# Patient Record
Sex: Female | Born: 1975 | Race: White | Hispanic: No | Marital: Married | Smoking: Never smoker
Health system: Southern US, Community
[De-identification: ages and names within clinical notes are randomized; demographics above are authoritative.]

## PROBLEM LIST (undated history)

## (undated) DIAGNOSIS — F419 Anxiety disorder, unspecified: Secondary | ICD-10-CM

## (undated) DIAGNOSIS — K429 Umbilical hernia without obstruction or gangrene: Secondary | ICD-10-CM

## (undated) DIAGNOSIS — F32A Depression, unspecified: Secondary | ICD-10-CM

## (undated) DIAGNOSIS — Z9889 Other specified postprocedural states: Secondary | ICD-10-CM

## (undated) DIAGNOSIS — R112 Nausea with vomiting, unspecified: Secondary | ICD-10-CM

## (undated) DIAGNOSIS — J45909 Unspecified asthma, uncomplicated: Secondary | ICD-10-CM

## (undated) DIAGNOSIS — K649 Unspecified hemorrhoids: Secondary | ICD-10-CM

## (undated) DIAGNOSIS — F329 Major depressive disorder, single episode, unspecified: Secondary | ICD-10-CM

## (undated) DIAGNOSIS — G43829 Menstrual migraine, not intractable, without status migrainosus: Secondary | ICD-10-CM

## (undated) DIAGNOSIS — Z8489 Family history of other specified conditions: Secondary | ICD-10-CM

## (undated) DIAGNOSIS — Q796 Ehlers-Danlos syndrome, unspecified: Secondary | ICD-10-CM

## (undated) HISTORY — PX: ANKLE SURGERY: SHX546

## (undated) HISTORY — DX: Menstrual migraine, not intractable, without status migrainosus: G43.829

## (undated) HISTORY — PX: WISDOM TOOTH EXTRACTION: SHX21

## (undated) HISTORY — DX: Ehlers-Danlos syndrome, unspecified: Q79.60

## (undated) HISTORY — PX: SHOULDER ARTHROSCOPY: SHX128

## (undated) HISTORY — PX: APPENDECTOMY: SHX54

## (undated) HISTORY — PX: MOUTH SURGERY: SHX715

## (undated) HISTORY — DX: Unspecified hemorrhoids: K64.9

## (undated) HISTORY — DX: Major depressive disorder, single episode, unspecified: F32.9

## (undated) HISTORY — PX: TUBAL LIGATION: SHX77

## (undated) HISTORY — PX: HERNIA REPAIR: SHX51

## (undated) HISTORY — PX: TONSILLECTOMY AND ADENOIDECTOMY: SHX28

## (undated) HISTORY — PX: GANGLION CYST EXCISION: SHX1691

## (undated) HISTORY — DX: Depression, unspecified: F32.A

## (undated) HISTORY — PX: INTUSSUSCEPTION REPAIR: SHX1847

## (undated) HISTORY — PX: TYMPANOSTOMY TUBE PLACEMENT: SHX32

---

## 2004-12-25 ENCOUNTER — Ambulatory Visit (HOSPITAL_COMMUNITY): Admission: RE | Admit: 2004-12-25 | Discharge: 2004-12-25 | Payer: Self-pay | Admitting: Family Medicine

## 2005-10-31 ENCOUNTER — Ambulatory Visit (HOSPITAL_COMMUNITY): Admission: RE | Admit: 2005-10-31 | Discharge: 2005-10-31 | Payer: Self-pay | Admitting: Orthopedic Surgery

## 2007-03-25 ENCOUNTER — Inpatient Hospital Stay (HOSPITAL_COMMUNITY): Admission: AD | Admit: 2007-03-25 | Discharge: 2007-03-30 | Payer: Self-pay | Admitting: Obstetrics and Gynecology

## 2007-03-27 ENCOUNTER — Ambulatory Visit: Payer: Self-pay | Admitting: Internal Medicine

## 2007-03-27 ENCOUNTER — Encounter (INDEPENDENT_AMBULATORY_CARE_PROVIDER_SITE_OTHER): Payer: Self-pay | Admitting: Obstetrics and Gynecology

## 2007-03-27 ENCOUNTER — Encounter: Payer: Self-pay | Admitting: Obstetrics and Gynecology

## 2007-03-31 ENCOUNTER — Ambulatory Visit (HOSPITAL_COMMUNITY): Admission: RE | Admit: 2007-03-31 | Discharge: 2007-03-31 | Payer: Self-pay | Admitting: Obstetrics and Gynecology

## 2007-04-03 ENCOUNTER — Inpatient Hospital Stay (HOSPITAL_COMMUNITY): Admission: AD | Admit: 2007-04-03 | Discharge: 2007-04-03 | Payer: Self-pay | Admitting: Obstetrics and Gynecology

## 2007-04-06 ENCOUNTER — Ambulatory Visit (HOSPITAL_COMMUNITY): Admission: RE | Admit: 2007-04-06 | Discharge: 2007-04-06 | Payer: Self-pay | Admitting: Obstetrics and Gynecology

## 2007-04-10 ENCOUNTER — Inpatient Hospital Stay (HOSPITAL_COMMUNITY): Admission: AD | Admit: 2007-04-10 | Discharge: 2007-04-16 | Payer: Self-pay | Admitting: Obstetrics and Gynecology

## 2007-04-13 ENCOUNTER — Encounter (INDEPENDENT_AMBULATORY_CARE_PROVIDER_SITE_OTHER): Payer: Self-pay | Admitting: Obstetrics and Gynecology

## 2009-01-30 ENCOUNTER — Inpatient Hospital Stay (HOSPITAL_COMMUNITY): Admission: AD | Admit: 2009-01-30 | Discharge: 2009-02-01 | Payer: Self-pay | Admitting: Obstetrics and Gynecology

## 2009-02-16 ENCOUNTER — Ambulatory Visit (HOSPITAL_COMMUNITY): Admission: RE | Admit: 2009-02-16 | Discharge: 2009-02-16 | Payer: Self-pay | Admitting: Obstetrics and Gynecology

## 2009-03-31 ENCOUNTER — Inpatient Hospital Stay (HOSPITAL_COMMUNITY): Admission: AD | Admit: 2009-03-31 | Discharge: 2009-04-01 | Payer: Self-pay | Admitting: Obstetrics and Gynecology

## 2009-05-12 ENCOUNTER — Encounter (INDEPENDENT_AMBULATORY_CARE_PROVIDER_SITE_OTHER): Payer: Self-pay | Admitting: Obstetrics and Gynecology

## 2009-05-12 ENCOUNTER — Inpatient Hospital Stay (HOSPITAL_COMMUNITY): Admission: RE | Admit: 2009-05-12 | Discharge: 2009-05-15 | Payer: Self-pay | Admitting: Obstetrics and Gynecology

## 2009-09-26 ENCOUNTER — Ambulatory Visit: Payer: Self-pay | Admitting: Family

## 2009-09-26 DIAGNOSIS — IMO0002 Reserved for concepts with insufficient information to code with codable children: Secondary | ICD-10-CM | POA: Insufficient documentation

## 2009-09-26 HISTORY — DX: Reserved for concepts with insufficient information to code with codable children: IMO0002

## 2009-11-11 ENCOUNTER — Encounter: Payer: Self-pay | Admitting: Family Medicine

## 2009-11-14 ENCOUNTER — Ambulatory Visit: Payer: Self-pay | Admitting: Family

## 2010-01-09 ENCOUNTER — Telehealth: Payer: Self-pay | Admitting: Family

## 2010-03-16 ENCOUNTER — Telehealth: Payer: Self-pay | Admitting: Family

## 2010-04-29 ENCOUNTER — Encounter: Payer: Self-pay | Admitting: *Deleted

## 2010-04-29 ENCOUNTER — Encounter: Payer: Self-pay | Admitting: Obstetrics and Gynecology

## 2010-05-08 ENCOUNTER — Ambulatory Visit
Admission: RE | Admit: 2010-05-08 | Discharge: 2010-05-08 | Payer: Self-pay | Source: Home / Self Care | Attending: Family | Admitting: Family

## 2010-05-08 DIAGNOSIS — G43909 Migraine, unspecified, not intractable, without status migrainosus: Secondary | ICD-10-CM | POA: Insufficient documentation

## 2010-05-08 NOTE — Progress Notes (Signed)
  Phone Note Outgoing Call   Summary of Call: Spoke with patient.  She discontinued zoloft due to somnolence.  Mood, however was improved when she was taking this.  She wishes to try something different.  Pt is no longer breast feeding.  Will give patient trial of Effexor.  Initial call taken by: Lemont Fillers FNP,  January 09, 2010 2:39 PM    New/Updated Medications: VENLAFAXINE HCL 37.5 MG XR24H-CAP (VENLAFAXINE HCL) one cap by mouth once daily for one week.  Increase to 2 caps by mouth daily on second week as tolerated. Prescriptions: VENLAFAXINE HCL 37.5 MG XR24H-CAP (VENLAFAXINE HCL) one cap by mouth once daily for one week.  Increase to 2 caps by mouth daily on second week as tolerated.  #60 x 2   Entered and Authorized by:   Lemont Fillers FNP   Signed by:   Lemont Fillers FNP on 01/09/2010   Method used:   [phone][facility]       [address] [place], [place]  [address]       Ph: [phone] or [phone]       Fax: [phone]   RxID:   [id number]

## 2010-05-08 NOTE — Assessment & Plan Note (Signed)
Summary: 1 month roa//lch--Rm 5   Vital Signs:  Patient profile:   35 year old female Height:      66 inches Weight:      122.25 pounds BMI:     19.80 Temp:     98.0 degrees F oral Pulse rate:   60 / minute Pulse rhythm:   regular Resp:     12 per minute BP sitting:   90 / 60  (left arm) Cuff size:   regular  Vitals Entered By: Mervin Kung CMA Duncan Dull) (November 14, 2009 2:31 PM) CC: Room 5  1 month follow up. Is Patient Diabetic? No   CC:  Room 5  1 month follow up.Marland Kitchen  History of Present Illness: Ms Mctigue is a 35 year old female who presents today for follow up of her post-partum depression.   1)Post-partum depression- Last visit she was started on Zoloft.  She did not increase dose form 25 to 50mg  since she was still nursing.  Overall, she has not noticed much change since starting the medication.  Denies any side effects.  Appetite is fair.  Pt has lost 2 pounds since last visit.   2)Mastitis- Notes that she has recently discontinued nursing and developed mastitis for which she is taking dicloxacillin.  Initially she had fever 102, malaise, breast pain.  Now feeling much better.   3)Dysplastic mole removal- Reports that last week she had a dysplastic mole removed by Dr. Dorita Sciara PA.  She has a follow up apt scheduled in 3 months with Dermatology.    Allergies: 1)  ! Ceclor 2)  ! Morphine  Past History:  Past Medical History: Gilbert's Depression S/P Concussion as teenager Dysplastic mole removal 8/11 (Dr. Dorita Sciara office)  Review of Systems       see hpi  Physical Exam  General:  Well-developed,well-nourished,in no acute distress; alert,appropriate and cooperative throughout examination Lungs:  Normal respiratory effort, chest expands symmetrically. Lungs are clear to auscultation, no crackles or wheezes. Heart:  Normal rate and regular rhythm. S1 and S2 normal without gallop, murmur, click, rub or other extra sounds. Psych:  Oriented X3, memory intact for  recent and remote, normally interactive, good eye contact, not anxious appearing, and not depressed appearing.     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  Pt to call us in 1 month and let us know how she is feeling.  Hopefully, weight will stabilize now that she is no longer nursing.    Complete Medication List: 1)  One-a-day Womens Prenatal 28-0.8 & 440 Mg Misc (Prenatal vit-fe fum-fa-omega) .... One daily 2)  Zoloft 50 Mg Tabs (Sertraline hcl) .... One tablet by mouth daily 3)  Dicloxacillin Sodium 250 Mg Caps (Dicloxacillin sodium) .Marland Kitchen.. 1 by mouth three times a day  Patient Instructions: 1)  Please call us in 1 month to let us know how you are doing.    Current Allergies (reviewed today): ! CECLOR ! MORPHINE

## 2010-05-08 NOTE — Progress Notes (Signed)
  Phone Note Other Incoming   Summary of Call: Pt requests rx be sent in as 75mg  tablets.  Rx sent to pharmacy.  Initial call taken by: Lemont Fillers FNP,  March 16, 2010 3:03 PM    New/Updated Medications: VENLAFAXINE HCL 75 MG XR24H-CAP (VENLAFAXINE HCL) one cap by mouth once daily Prescriptions: VENLAFAXINE HCL 75 MG XR24H-CAP (VENLAFAXINE HCL) one cap by mouth once daily  #30 x 5   Entered and Authorized by:   Lemont Fillers FNP   Signed by:   Lemont Fillers FNP on 03/16/2010   Method used:   [facility] [phone][facility] ([facility]ail[facility]       [address] [place], [place]  [address]       Ph: [phone] or [phone]       Fax: [phone]   RxID:   [id number]

## 2010-05-08 NOTE — Assessment & Plan Note (Signed)
Summary: NEW PATIENT--PER [name]   Vital Signs:  Patient profile:   35 year old female Height:      66 inches Weight:      124 pounds BMI:     20.09 Pulse rate:   67 / minute BP sitting:   95 / 56  (left arm) Cuff size:   regular  Vitals Entered By: Geanie Kenning (September 26, 2009 3:23 PM) CC: get established Is Patient Diabetic? No   CC:  get established.  History of Present Illness: Ms [name] is a 35 year old female who presents today to establish care.  She is 4 months post partum and wishes to discuss concern of depression.  During her most recent pregnancy she was confined to bedrest at 23 weeks.  The child was born at term and is healthy.  During her first pregnancy with her now 52 1/2 year old son,  she delivered at 42 weeks and 6 days.  When the child was 34 months old they learned that he has mild CP.  She expresses that she continues to struggle with stress related to her son's pre-term birth and with his diagnosis of CP.    Most recently over this past winter while on bedrest, she noted that she became depressed.  She did not seek medical treatment at that time due to pregnancy and being confined to bedrest.  Now she notes that she "snaps" easily at her toddler.  She notes crying spells and difficulty motivating in the mornings.  Currently, she is not sleeping well.  She also reports poor appetite.  Denies any significant issues with anxiety- except worry about her children at times. 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  She is currently nursing and discussed initiation of SSRI with her child's pediatrician.  They are comfortable with her starting Zoloft.    Preventive Screening-Counseling & Management  Alcohol-Tobacco     Alcohol drinks/day: 0     Smoking Status: never  Problems Prior to Update: 1)  Postpartum Depression  (ICD-648.40)  Medications Prior to Update: 1)  One-A-Day Womens Prenatal 28-0.8 & 440 Mg Misc  (Prenatal Vit-Fe Fum-Fa-Omega) .... One Daily  Current Medications (verified): 1)  One-A-Day Womens Prenatal 28-0.8 & 440 Mg Misc (Prenatal Vit-Fe Fum-Fa-Omega) .... One Daily  Allergies (verified): 1)  ! Ceclor 2)  ! Morphine  Comments:  Nurse/Medical Assistant: The patient's medications and allergies were reviewed with the patient and were updated in the Medication and Allergy Lists. Geanie Kenning (September 26, 2009 3:23 PM)  Past History:  Past Medical History: Last updated: 09/20/2009 Gilbert's Depression S/P Concussion as teenager  Past Surgical History: Last updated: 09/20/2009 Intussusception at 7 months, 1980 (incidental appendectomy) Ear Tubes 1983 Tonsils and Adenoids 1987 Epigastric Hernia repair 1994 R Ankle Surgery 1996 [place]ome teeth 1996 Gum Graft (oral surgery) 1998 L wrist ganglion cyst removal x2 2000 R shoulder surgery 2003 Csection x2 2009, 2011 Tubal Ligation  Family History: Last updated: 09/20/2009 + ANA, ? Lupus- Mom Hyperlipidemia- Dad Polycystic Kidney Dz- MGM, maternal uncle GBM brain tumor- maternal uncle DM- PGM Pancreatic Ca- MGF CAD- PGM Alzheimer's- PGF  Social History: Last updated: 09/20/2009 married to [place] 2 children- Hart Carwin (2009), Raquel Sarna (2011) Hart Carwin w/ mild CP (ex-preemie) [place] [employer]  Risk Factors: Alcohol Use: 0 (09/26/2009)  Risk Factors: Smoking Status: never (09/26/2009)  Social History: Smoking Status:  never  Physical Exam  General:  Well-developed,well-nourished,in no acute  distress; alert,appropriate and cooperative throughout examination Psych:  Cognition and judgment appear intact. Alert and cooperative with normal attention span and concentration. No apparent delusions, illusions, hallucinations   Impression & Recommendations:  Problem # 1:  POSTPARTUM DEPRESSION (ICD-648.40) Assessment New Will initiate trial of Zoloft.  Start at 25mg  by mouth daily and plan to titrate upward to 50mg  in 1  week as tolerated.  30 minutes spent with patient.   Greater than 50% of this time was spent counseling patient on her depression.  We also discussed the possiblity of therapy and she will consider this.    Complete Medication List: 1)  One-a-day Womens Prenatal 28-0.8 & 440 Mg Misc (Prenatal vit-fe fum-fa-omega) .... One daily 2)  Zoloft 50 Mg Tabs (Sertraline hcl) .... Start 1/2 tab by mouth daily x 1 week, then increase to 1 tab by mouth daily on second week  Patient Instructions: 1)  Please start 1/2 tablet one a day for one week, then increase to a full tablet. 2)  It will likely take several weeks before you will notice improvement. 3)  Side effects of this medicine may include drowsiness or nausea.  If this becomes an issue for you call for further instructions. 4)  Very rarely people may develop suicidal thoughts when taking these types of medicines- should this happen to you, discontinue medication and go directly to the emergency room. 5)  Please arrange a follow up appointment in 1 month.  Prescriptions: ZOLOFT 50 MG TABS (SERTRALINE HCL) start 1/2 tab by mouth daily x 1 week, then increase to 1 tab by mouth daily on second week  #30 x 2   Entered and Authorized by:   Nance Pear FNP   Signed by:   Nance Pear FNP on 09/26/2009   Method used:   [facility]        [facility] [phone]       [place], [place]  [address]       Ph: [phone] or [phone]       Fax: [phone]   RxID:   [id number]

## 2010-05-08 NOTE — Miscellaneous (Signed)
Summary: Orders Update  Clinical Lists Changes  Medications: Added new medication of DICLOXACILLIN SODIUM 250 MG CAPS (DICLOXACILLIN SODIUM) 1 by mouth three times a day - Signed Rx of DICLOXACILLIN SODIUM 250 MG CAPS (DICLOXACILLIN SODIUM) 1 by mouth three times a day;  #30 x 0;  Signed;  Entered by: Loreen Freud DO;  Authorized by: Loreen Freud DO;  Method used: [facility]  [place] [address] [address] [place], [place], [place]  [address], Ph: [phone] or [phone], Fax: [phone]    Prescriptions: DICLOXACILLIN SODIUM 250 MG CAPS (DICLOXACILLIN SODIUM) 1 by mouth three times a day  #30 x 0   Entered and Authorized by:   Loreen Freud DO   Signed by:   Loreen Freud DO on 11/11/2009   Method used:   [facility]        [facility]  [place] [address] ([facility]ail)       [address]       [place], [place]  [address]       Ph: [phone] or [phone]       Fax: [phone]   RxID:   [id number]

## 2010-05-16 NOTE — Assessment & Plan Note (Signed)
Summary: menstral migraines//lch--rm 4   Vital Signs:  Patient profile:   35 year old female Height:      66 inches Weight:      119.50 pounds BMI:     19.36 Temp:     97.9 degrees F oral Pulse rate:   84 / minute Pulse rhythm:   regular Resp:     16 per minute BP sitting:   80 / 60  (right arm) Cuff size:   regular  Vitals Entered By: Mervin Kung CMA Duncan Dull) (May 08, 2010 2:16 PM) CC: Pt states she is having menstrual related migraines x 6 months and they are getting worse in severity. Is Patient Diabetic? No Pain Assessment Patient in pain? no      Comments Pt no longer takes Zoloft. Venlafaxine is 75mg  once daily. Nicki Guadalajara Fergerson CMA Duncan Dull)  May 08, 2010 2:21 PM    Primary Care Provider:  Lemont Fillers FNP  CC:  Pt states she is having menstrual related migraines x 6 months and they are getting worse in severity..  History of Present Illness: Ms. Bashor is a 35 year old female who presents today with chief complaint of headache.  She reports that her headaches start several days before each period and lasts several days after the start of her period.  She has had 6 periods since she stopped breasfeeding.  Notes on Friday she had associated photophobia with driving. She reports that this weekend "nothing made it better." She had severe left sided head pain with associated  left facial tenderness and sense of left eye "bulging."   Also had associated nausea and vomitting which improved with use of zofran.  Has been needing to use excedrin Migraine on a daily basis with minimal relief. Reports period started today.  Periods have had heavier bleeding recently since she stopped breast feeding.   Depression- feels overall that this is improved on the Effexor.  Wishes to continue the same.   Allergies: 1)  ! Ceclor 2)  ! Morphine  Past History:  Past Medical History: Last updated: 11/14/2009 Gilbert's Depression S/P Concussion as teenager Dysplastic mole  removal 8/11 (Dr. Dorita Sciara office)  Past Surgical History: Intussusception at 7 months, 1980 (incidental appendectomy) Ear Tubes 1983 Tonsils and Adenoids 1987 Epigastric Hernia repair 1994 R Ankle Surgery 1996 Wisdom  teeth 1996 Gum Graft (oral surgery) 1998 L wrist ganglion cyst removal x2 2000 R shoulder surgery 2003 Csection x2 2009, 2011 Tubal Ligation  Review of Systems       see HPI  Physical Exam  General:  Well-developed,well-nourished,in no acute distress; alert,appropriate and cooperative throughout examination Neurologic:  alert & oriented X3 and gait normal.   Psych:  Cognition and judgment appear intact. Alert and cooperative with normal attention span and concentration. No apparent delusions, illusions, hallucinations   Impression & Recommendations:  Problem # 1:  MIGRAINE HEADACHE (ICD-346.90) Assessment Deteriorated Patient with what appears to be migraine HA's exacerbated by hormonal changes associated with menstrual cycle.  Will give patient a trial of imitrex to be used on a as needed basis.  Will also add naproxen prophylactically 2-3 days before the start of each period and then continue for several days into her menses.  We discussed addition of seasonique, however she wishes to try to avoid hormones at this time.   Her updated medication list for this problem includes:    Imitrex 50 Mg Tabs (Sumatriptan succinate) ..... One tablet by mouth at start of migraine, may repeat  in 2 hours if needed.    Naproxen 500 Mg Tabs (Naproxen) ..... One tablet by mouth two times a day twice daily for 2-3 days before period and 2-3 days in to your period.  Problem # 2:  POSTPARTUM DEPRESSION (ICD-648.40) Assessment: Improved Depression appears stable. She is tolerating Venlafaxine without difficulty.  Continue same.    Complete Medication List: 1)  One-a-day Womens Prenatal 28-0.8 & 440 Mg Misc (Prenatal vit-fe fum-fa-omega) .... One daily 2)  Venlafaxine Hcl 75 Mg  Xr24h-cap (Venlafaxine hcl) .... One cap by mouth once daily 3)  Imitrex 50 Mg Tabs (Sumatriptan succinate) .... One tablet by mouth at start of migraine, may repeat in 2 hours if needed. 4)  Naproxen 500 Mg Tabs (Naproxen) .... One tablet by mouth two times a day twice daily for 2-3 days before period and 2-3 days in to your period.  Patient Instructions: 1)  Call if your symptoms worsen or do not improve.  Prescriptions: NAPROXEN 500 MG TABS (NAPROXEN) one tablet by mouth two times a day twice daily for 2-3 days before period and 2-3 days in to your period.  #30 x 3   Entered and Authorized by:   Lemont Fillers FNP   Signed by:   Lemont Fillers FNP on 05/08/2010   Method used:   [facility] to        [facility]  [address] [phone][facility] ([facility]ail)       [address] [place], [place]  [address]       Ph: [phone] or [phone]       Fax: [phone]   RxID:   [id number] IMITREX 50 MG TABS (SUMATRIPTAN SUCCINATE) one tablet by mouth at start of migraine, may repeat in 2 hours if needed.  #10 x 0   Entered and Authorized by:   Lemont Fillers FNP   Signed by:   Lemont Fillers FNP on 05/08/2010   Method used:   [facility]        [facility]  [address] [phone][facility] ([facility]ail)       [address] [place], [place]  [address]       Ph: [phone] or [phone]       Fax: [phone]   RxID:   [id number]    Orders Added: 1)  Est. Patient Level III [42706]    Current Allergies (reviewed today): ! CECLOR ! MORPHINE

## 2010-06-27 LAB — CBC
HCT: 30.3 % — ABNORMAL LOW (ref 36.0–46.0)
HCT: 37 % (ref 36.0–46.0)
Hemoglobin: 10.4 g/dL — ABNORMAL LOW (ref 12.0–15.0)
Hemoglobin: 12.5 g/dL (ref 12.0–15.0)
MCHC: 33.8 g/dL (ref 30.0–36.0)
MCHC: 34.3 g/dL (ref 30.0–36.0)
MCV: 98.8 fL (ref 78.0–100.0)
MCV: 99.5 fL (ref 78.0–100.0)
Platelets: 150 10*3/uL (ref 150–400)
Platelets: 222 10*3/uL (ref 150–400)
RBC: 3.04 MIL/uL — ABNORMAL LOW (ref 3.87–5.11)
RBC: 3.74 MIL/uL — ABNORMAL LOW (ref 3.87–5.11)
RDW: 12.9 % (ref 11.5–15.5)
RDW: 13.3 % (ref 11.5–15.5)
WBC: 11.3 10*3/uL — ABNORMAL HIGH (ref 4.0–10.5)
WBC: 12 10*3/uL — ABNORMAL HIGH (ref 4.0–10.5)

## 2010-06-27 LAB — RPR: RPR Ser Ql: NONREACTIVE

## 2010-07-09 LAB — COMPREHENSIVE METABOLIC PANEL
ALT: 12 U/L (ref 0–35)
Albumin: 3.3 g/dL — ABNORMAL LOW (ref 3.5–5.2)
BUN: 6 mg/dL (ref 6–23)
CO2: 20 mEq/L (ref 19–32)
GFR calc Af Amer: >60 mL/min (ref >60)
Glucose, Bld: 134 mg/dL — ABNORMAL HIGH (ref 70–99)
Total Bilirubin: 0.6 mg/dL (ref 0.3–1.2)
Total Protein: 6.2 g/dL (ref 6.0–8.3)

## 2010-07-09 LAB — URINALYSIS, ROUTINE W REFLEX MICROSCOPIC
Glucose, UA: 500 mg/dL — AB
Hgb urine dipstick: NEGATIVE
Nitrite: NEGATIVE
Protein, ur: NEGATIVE mg/dL
pH: 6 (ref 5.0–8.0)

## 2010-07-12 LAB — URINALYSIS, MICROSCOPIC ONLY
Bilirubin Urine: NEGATIVE
Glucose, UA: 500 mg/dL — AB
Leukocytes, UA: NEGATIVE
Nitrite: NEGATIVE
Protein, ur: NEGATIVE mg/dL
Specific Gravity, Urine: <1.005 — ABNORMAL LOW (ref 1.005–1.030)
Urobilinogen, UA: 0.2 mg/dL (ref 0.0–1.0)
pH: 6 (ref 5.0–8.0)

## 2010-08-07 ENCOUNTER — Other Ambulatory Visit: Payer: Self-pay | Admitting: Family Medicine

## 2010-08-07 DIAGNOSIS — T148XXA Other injury of unspecified body region, initial encounter: Secondary | ICD-10-CM

## 2010-08-07 HISTORY — DX: Other injury of unspecified body region, initial encounter: T14.8XXA

## 2010-08-08 ENCOUNTER — Other Ambulatory Visit (INDEPENDENT_AMBULATORY_CARE_PROVIDER_SITE_OTHER): Payer: Self-pay

## 2010-08-08 DIAGNOSIS — T148XXA Other injury of unspecified body region, initial encounter: Secondary | ICD-10-CM

## 2010-08-08 LAB — PROTIME-INR: Prothrombin Time: 13.5 seconds (ref 11.6–15.2)

## 2010-08-08 LAB — APTT: aPTT: 35 seconds (ref 24–37)

## 2010-08-09 LAB — CBC WITH DIFFERENTIAL/PLATELET
Basophils Relative: 0.4 % (ref 0.0–3.0)
Eosinophils Absolute: 0 10*3/uL (ref 0.0–0.7)
HCT: 38.6 % (ref 36.0–46.0)
Hemoglobin: 13.2 g/dL (ref 12.0–15.0)
Lymphs Abs: 3.3 10*3/uL (ref 0.7–4.0)
MCHC: 34.1 g/dL (ref 30.0–36.0)
MCV: 94.3 fl (ref 78.0–100.0)
Monocytes Absolute: 0.3 10*3/uL (ref 0.1–1.0)
Neutro Abs: 0.9 10*3/uL — ABNORMAL LOW (ref 1.4–7.7)
Neutrophils Relative %: 19.9 % — ABNORMAL LOW (ref 43.0–77.0)
RBC: 4.09 Mil/uL (ref 3.87–5.11)

## 2010-08-09 LAB — TSH: TSH: 0.43 u[IU]/mL (ref 0.35–5.50)

## 2010-08-09 LAB — HEPATIC FUNCTION PANEL: Albumin: 4.8 g/dL (ref 3.5–5.2)

## 2010-08-09 LAB — BASIC METABOLIC PANEL
BUN: 17 mg/dL (ref 6–23)
Calcium: 9.3 mg/dL (ref 8.4–10.5)
Creatinine, Ser: 0.8 mg/dL (ref 0.4–1.2)
GFR: 83.27 mL/min (ref >60.00)

## 2010-08-21 NOTE — Discharge Summary (Signed)
[name], [name] NO.:  [id number]   MEDICAL RECORD NO.:  [id number]          PATIENT TYPE:  INP   LOCATION:  9152                          [facility]ILITY:  [facility]   PHYSICIAN:  Guy Sandifer. Henderson Cloud, M.D. DATE OF BIRTH:  1975/04/28   DATE OF ADMISSION:  03/25/2007  DATE OF DISCHARGE:  03/30/2007                               DISCHARGE SUMMARY   ADMISSION DIAGNOSES:  1. Intrauterine pregnancy at 29-1/7 weeks estimated gestational age.  2. Vaginal bleeding.  3. Preterm contractions.   DISCHARGE DIAGNOSIS:  1. Intrauterine pregnancy at 29-1/7 weeks estimated gestational age.  2. Vaginal bleeding.  3. Preterm contractions.   REASON FOR ADMISSION:  This patient is 35 year old married white female,  G1, P0, who complains of painless vaginal bleeding, passing a rather  large amount of blood soaking her clothes, followed by the onset of  uterine contractions.  Evaluation in triage revealed cervix to be long  and closed to inspection with a minimal amount of bloody mucus.  Fetal  heart tones were 140s, reactive, occasional variable decelerations, and  ultrasound revealed a BPP of 6/8 with an AFI of 13.6, no previa or  abruption noted and a cervical length of 2.7 cm.   HOSPITAL COURSE:  The patient was admitted to the hospital, underwent  magnesium sulfate tocolysis.  She also received steroids.  The first  evening of March 25, 2007, she had some increased contractions and  was given subcutaneous terbutaline.  This helped at that time.  The next  morning she had a second episode of increasing uterine activity.  Magnesium sulfate was increased to 2.5 grams per hour, and she was given  a second shot of terbutaline.  She responded well to this.  The  magnesium sulfate was subsequently decreased to 2 grams an hour on  December 18 late.  On the evening of December 18, there was a question  of 2 isolated late decelerations.  That morning Kleihauer-Betke was  found to be  negative, and her CBC was stable.  Consultation with  maternal fetal medicine was then obtained.  By March 28, 2007, she  complained of some mid epigastric and chest discomfort.  Vital signs  were stable, and she had an O2 saturation of 95%.  There were one or two  decelerations that were questionably variable verses late.  Contractions  were 1-7 per hour.  PH labs were drawn.   By March 29, 2007, the patient was stable, had no further bleeding.  The magnesium sulfate was weaned off, and she was continued on oral  terbutaline.  On the day of discharge, she is feeling fine.  Fetal heart  rate is reactive with no significant decelerations for the last 24-48  hours. Uterine contractions are irregular and mild.  Telephone  consultation with Dr. Ander Slade by Dr. Rana Snare was undertaken.  He agreed with  plans for discharge and close followup tomorrow with him in the hospital  and repeat monitoring on Friday.  I discussed this with the patient. She  was also comfortable with this plan.   CONDITION ON DISCHARGE:  Stable.   DIET:  Regular as tolerated.   ACTIVITY:  Modified bedrest, pelvic rest.   Careful instructions are reviewed.  She will call for any abrupt  symptoms.   MEDICATIONS:  Terbutaline 2.5 mg one p.o. q.4 h.   FOLLOWUP:  Tomorrow with Dr. Ander Slade and then nonstress test Friday in  maternity admissions unit.      Guy Sandifer Henderson Cloud, M.D.  Electronically Signed     JET/MEDQ  D:  03/30/2007  T:  03/30/2007  Job:  [id number]

## 2010-08-21 NOTE — Op Note (Signed)
NAMEBREANA, LITTS NO.:  [id number]   MEDICAL RECORD NO.:  [id number]          PATIENT TYPE:  INP   LOCATION:  9399                          [facility]   PHYSICIAN:  Michelle L. Grewal, M.D.DATE OF BIRTH:  [birth year]   DATE OF PROCEDURE:  04/13/2007  DATE OF DISCHARGE:                               OPERATIVE REPORT   PREOP DIAGNOSIS:  Intrauterine pregnancy at 64 and 6, marginal posterior  placenta previa, P-PROM and failed tocolysis.   POSTOPERATIVE DIAGNOSIS:  Intrauterine pregnancy at 31 and 6, marginal  posterior placenta previa, P-PROM and failed tocolysis.   PROCEDURE:  Primary low transverse cesarean section.   SURGEON:  Dr. Vincente Poli.   ANESTHESIA:  Spinal.   SPECIMENS:  Placenta sent to pathology.   ESTIMATED BLOOD LOSS:  500 mL.   DRAINS:  Foley.   COMPLICATIONS:  None.   PROCEDURE:  The patient was taken to the operating room from her room in  antenatal 152.  She was prepped and draped after spinal was placed and a  Foley catheter had been placed where she was on  Antenatal.  A low transverse incision was made, carried down to the  fascia. The fascia scored in the midline and extended laterally.  The  rectus muscles separated in the midline.  The peritoneum was entered  bluntly.  The peritoneal incision was then stretched.  The bladder blade  was inserted.  The lower uterine segment was identified.  The bladder  flap was created sharply and then digitally.  The bladder blade was  readjusted.   A low transverse incision was made in the uterus. The uterus was entered  using a hemostat.  Amniotic fluid was noted be slightly bloody.  The  baby was in the cephalic presentation.  Was delivered easily.  There was  a nuchal cord times one.  The baby was a female infant and was actually  immediately given to the neonatal team and subsequently taken to NICU  and intubated.  The cord had been clamped and cut.  Cord pH was  obtained.  The cord  blood was then obtained.   The placenta was manually removed and noted be normal intact with three-  vessel cord. It was sent to pathology.  The uterus was exteriorized.  It  was cleared of all clots and debris.  The uterine incision was closed in  one layer and was hemostatic.  The uterus was returned to the abdomen.  Irrigation was performed.  The peritoneum was closed  using O Vicryl.  The fascia was closed using O Vicryl in a running  stitch.  After a subcutaneous layer was noted least hemostatic the skin  was closed with a subcuticular 3-0 Vicryl and the skin was closed with  staples.  All sponge, lap and instrument counts were correct x2.  The  patient went to the recovery room in stable condition.      Michelle L. Vincente Poli, M.D.  Electronically Signed     MLG/MEDQ  D:  04/13/2007  T:  04/13/2007  Job:  [id number]

## 2010-08-24 NOTE — Discharge Summary (Signed)
[name], FRAZEE NO.:  [id number]   MEDICAL RECORD NO.:  [id number]          PATIENT TYPE:  INP   LOCATION:  9306                          FACILITY:  [facility]   PHYSICIAN:  [name], M.D.   DATE OF BIRTH:  1975-10-07   DATE OF ADMISSION:  04/10/2007  DATE OF DISCHARGE:  04/16/2007                               DISCHARGE SUMMARY   ADMITTING DIAGNOSES:  1. Intrauterine pregnancy at 38 and 2/7 weeks' estimated gestational      age.  2. History of marginal placenta previa.  3. Preterm labor.   DISCHARGE DIAGNOSES:  1. Status-post low transverse cesarean section.  2. Viable female infant.   PROCEDURE:  Primary low transverse cesarean section.   REASON FOR ADMISSION:  Please see written H&P.   HOSPITAL COURSE:  The patient is a 35 year old primigravida that was  admitted to [facility] at 66 and 2/7 weeks' estimated  gestational age with history of marginal placenta previa with vaginal  bleeding and preterm labor.  The patient had been at home on bedrest on  oral terbutaline.  The patient had awakened during the night with noted  vaginal bleeding and increasing contractions.  Ultrasound was performed  which revealed the cervical os was open with marginal placenta previa.  Hemoglobin was 11.8.  Otherwise, vital signs were normal.  Fetal heart  tone was 142.  The patient was now admitted, and IV magnesium sulfate  was started.  Contractions did become less intense.  Bleeding resolved.  The patient was then tapered from the magnesium sulfate and was started  on Procardia which held well for 2 days.  However, the patient did note  some increasing contractions and intensity.  Fetal heart tones continued  to be reactive in the 130s to 140s.  The patient was discontinued from  the Procardia and started on magnesium sulfate.  Shortly thereafter, the  patient did note some leakage of amniotic fluid and decision was now  made to proceed with a primary  low transverse cesarean section.   The patient was then taken to the operating room where spinal anesthesia  was administered without difficulty.  A low transverse incision was made  with delivery of a viable female infant weighing 4 pounds 9 ounces with  Apgars of 7 at 1 minute, 8 at 5 minutes.  NICU team was present and baby  was taken to the NICU.  The patient tolerated the procedure well and was  taken to the recovery room in stable condition.  Postoperative day #1,  the patient was without complaint.  Vital signs were stable.  Fundus  firm and nontender.  Incision was clean, dry, and intact.  Laboratory  findings revealed hemoglobin stable at 10.6.  Postoperative day #2, the  patient was without complaint.  Vital signs were stable.  She was  afebrile.  Abdomen soft.  Fundus firm and nontender.  Incision was  clean, dry, and intact.  The patient was ambulating well, tolerating a  regular diet without complaints of nausea and vomiting.  On  postoperative day #3, the patient was without complaint.  Vital  signs  remained stable.  She was afebrile.  Fundus firm and nontender.  Incision was clean, dry, and intact.  Staples were removed, and the  patient was later discharged home.   CONDITION ON DISCHARGE:  Good.   DIET:  Regular as tolerated.   ACTIVITY:  No heavy lifting.  No driving x2 weeks.  No vaginal entry.   FOLLOWUP:  Patient to follow up in the office in 1 week for an incision  check.  She is to call for a temperature greater than 100 degrees,  persistent nausea/vomiting, heavy vaginal bleeding, and/or redness or  drainage from the incisional site.   DISCHARGE MEDICATIONS:  1. Vicodin, #30, one p.o. every 4 to 6 hours p.r.n.  2. Motrin 600 mg every 6 hours.  3. Prenatal vitamins 1 p.o. daily.  4. Colace 1 p.o. daily p.r.n.      [name], N.P.      [name], M.D.  Electronically Signed    CC/MEDQ  D:  04/28/2007  T:  04/28/2007  Job:  [id number]

## 2010-09-26 ENCOUNTER — Other Ambulatory Visit: Payer: Self-pay | Admitting: Family

## 2010-10-05 ENCOUNTER — Other Ambulatory Visit: Payer: Self-pay | Admitting: Family Medicine

## 2010-10-05 MED ORDER — VENLAFAXINE HCL ER 75 MG PO CP24: 75.0000 mg | ORAL_CAPSULE | Freq: Every day | ORAL | Status: DC

## 2010-12-27 LAB — CBC
Hemoglobin: 10.6 — ABNORMAL LOW
MCV: 96.5
Platelets: 272
Platelets: 312
RDW: 12.4
RDW: 12.5
WBC: 10.6 — ABNORMAL HIGH
WBC: 10.8 — ABNORMAL HIGH

## 2010-12-27 LAB — TYPE AND SCREEN: Antibody Screen: NEGATIVE

## 2010-12-27 LAB — URINALYSIS, ROUTINE W REFLEX MICROSCOPIC
Hgb urine dipstick: NEGATIVE
Protein, ur: NEGATIVE
Urobilinogen, UA: 0.2

## 2010-12-27 LAB — HEMATOCRIT: HCT: 29.8 — ABNORMAL LOW

## 2010-12-27 LAB — MAGNESIUM: Magnesium: 4.9 — ABNORMAL HIGH

## 2010-12-27 LAB — HEMOGLOBIN: Hemoglobin: 10.5 — ABNORMAL LOW

## 2011-01-11 LAB — CBC
HCT: 31.3 — ABNORMAL LOW
Hemoglobin: 11 — ABNORMAL LOW
Hemoglobin: 11.5 — ABNORMAL LOW
MCHC: 35.1
MCV: 97.8
RBC: 3.2 — ABNORMAL LOW
RBC: 3.37 — ABNORMAL LOW
WBC: 10.5

## 2011-01-11 LAB — KLEIHAUER-BETKE STAIN: Quantitation Fetal Hemoglobin: 0

## 2011-02-21 ENCOUNTER — Other Ambulatory Visit: Payer: Self-pay | Admitting: Family Medicine

## 2011-02-21 DIAGNOSIS — S134XXA Sprain of ligaments of cervical spine, initial encounter: Secondary | ICD-10-CM

## 2011-02-21 MED ORDER — CYCLOBENZAPRINE HCL 10 MG PO TABS: 10.0000 mg | ORAL_TABLET | Freq: Three times a day (TID) | ORAL | Status: AC | PRN

## 2011-04-12 ENCOUNTER — Other Ambulatory Visit: Payer: Self-pay | Admitting: Family Medicine

## 2011-04-12 DIAGNOSIS — J111 Influenza due to unidentified influenza virus with other respiratory manifestations: Secondary | ICD-10-CM

## 2011-04-12 MED ORDER — OSELTAMIVIR PHOSPHATE 75 MG PO CAPS: ORAL_CAPSULE | ORAL | Status: DC

## 2011-04-24 ENCOUNTER — Other Ambulatory Visit: Payer: Self-pay | Admitting: Family Medicine

## 2011-04-24 DIAGNOSIS — J329 Chronic sinusitis, unspecified: Secondary | ICD-10-CM

## 2011-04-24 MED ORDER — CLARITHROMYCIN ER 500 MG PO TB24: ORAL_TABLET | ORAL | Status: DC

## 2011-08-28 ENCOUNTER — Other Ambulatory Visit: Payer: Self-pay | Admitting: Family Medicine

## 2011-08-28 DIAGNOSIS — N63 Unspecified lump in unspecified breast: Secondary | ICD-10-CM

## 2011-09-10 ENCOUNTER — Ambulatory Visit
Admission: RE | Admit: 2011-09-10 | Discharge: 2011-09-10 | Disposition: A | Discharge status: Home / Self Care | Payer: BC Managed Care – PPO | Source: Ambulatory Visit | Attending: Family Medicine | Admitting: Family Medicine

## 2011-09-10 ENCOUNTER — Other Ambulatory Visit: Payer: Self-pay | Admitting: Family Medicine

## 2011-09-10 DIAGNOSIS — N63 Unspecified lump in unspecified breast: Secondary | ICD-10-CM

## 2011-10-08 ENCOUNTER — Other Ambulatory Visit: Payer: Self-pay | Admitting: Surgery

## 2011-11-30 ENCOUNTER — Other Ambulatory Visit: Payer: Self-pay | Admitting: Family Medicine

## 2011-11-30 MED ORDER — DESVENLAFAXINE SUCCINATE ER 50 MG PO TB24: 50.0000 mg | ORAL_TABLET | Freq: Every day | ORAL | Status: DC

## 2011-12-10 ENCOUNTER — Telehealth: Payer: Self-pay | Admitting: *Deleted

## 2011-12-10 NOTE — Telephone Encounter (Signed)
Patient had a voucher and should not need prior authorization.  Will forward to PCP.

## 2011-12-10 NOTE — Telephone Encounter (Signed)
Prior auth needed for pristiq, form is on your desk.  You are not pt's PCP.

## 2011-12-11 NOTE — Telephone Encounter (Signed)
Noted  

## 2011-12-17 NOTE — Telephone Encounter (Signed)
Prior Berkley Harvey was given verbally over the phone for pristiq, advised pharmacy.

## 2011-12-24 ENCOUNTER — Other Ambulatory Visit: Payer: Self-pay | Admitting: Obstetrics and Gynecology

## 2012-01-03 DIAGNOSIS — Z23 Encounter for immunization: Secondary | ICD-10-CM

## 2012-01-13 ENCOUNTER — Telehealth (INDEPENDENT_AMBULATORY_CARE_PROVIDER_SITE_OTHER): Payer: BC Managed Care – PPO | Admitting: *Deleted

## 2012-01-13 DIAGNOSIS — Z23 Encounter for immunization: Secondary | ICD-10-CM

## 2012-01-13 NOTE — Telephone Encounter (Signed)
Late entry: pt received flu vaccine via this nurse on 01-03-12, no adverse reaction noted.

## 2012-03-25 ENCOUNTER — Other Ambulatory Visit: Payer: Self-pay | Admitting: Family Medicine

## 2012-03-25 DIAGNOSIS — J40 Bronchitis, not specified as acute or chronic: Secondary | ICD-10-CM

## 2012-03-25 MED ORDER — AZITHROMYCIN 250 MG PO TABS: ORAL_TABLET | ORAL | Status: DC

## 2012-07-02 ENCOUNTER — Other Ambulatory Visit: Payer: Self-pay | Admitting: Family Medicine

## 2012-07-02 LAB — LIPID PANEL
HDL: 57.8 mg/dL (ref >39.00)
LDL Cholesterol: 38 mg/dL (ref 0–99)
Total CHOL/HDL Ratio: 2
Triglycerides: 14 mg/dL (ref 0.0–149.0)
VLDL: 2.8 mg/dL (ref 0.0–40.0)

## 2012-07-02 LAB — CBC WITH DIFFERENTIAL/PLATELET
Eosinophils Relative: 1.8 % (ref 0.0–5.0)
HCT: 39.9 % (ref 36.0–46.0)
Hemoglobin: 13.4 g/dL (ref 12.0–15.0)
Lymphs Abs: 1.9 10*3/uL (ref 0.7–4.0)
Monocytes Relative: 7.1 % (ref 3.0–12.0)
Neutro Abs: 3.7 10*3/uL (ref 1.4–7.7)
RDW: 13.1 % (ref 11.5–14.6)

## 2012-07-02 LAB — COMPREHENSIVE METABOLIC PANEL
ALT: 16 U/L (ref 0–35)
CO2: 27 mEq/L (ref 19–32)
Creatinine, Ser: 0.8 mg/dL (ref 0.4–1.2)
GFR: 91.01 mL/min (ref >60.00)
Total Bilirubin: 1.2 mg/dL (ref 0.3–1.2)

## 2012-08-11 ENCOUNTER — Other Ambulatory Visit: Payer: Self-pay | Admitting: Family Medicine

## 2012-08-11 DIAGNOSIS — R220 Localized swelling, mass and lump, head: Secondary | ICD-10-CM

## 2012-08-11 DIAGNOSIS — R519 Headache, unspecified: Secondary | ICD-10-CM

## 2012-08-18 ENCOUNTER — Ambulatory Visit (HOSPITAL_BASED_OUTPATIENT_CLINIC_OR_DEPARTMENT_OTHER)
Admission: RE | Admit: 2012-08-18 | Discharge: 2012-08-18 | Disposition: A | Discharge status: Home / Self Care | Payer: BC Managed Care – PPO | Source: Ambulatory Visit | Attending: Family Medicine | Admitting: Family Medicine

## 2012-08-18 DIAGNOSIS — R220 Localized swelling, mass and lump, head: Secondary | ICD-10-CM

## 2012-08-18 DIAGNOSIS — R519 Headache, unspecified: Secondary | ICD-10-CM

## 2012-08-18 DIAGNOSIS — R51 Headache: Secondary | ICD-10-CM | POA: Insufficient documentation

## 2012-08-18 DIAGNOSIS — D164 Benign neoplasm of bones of skull and face: Secondary | ICD-10-CM | POA: Insufficient documentation

## 2012-10-12 ENCOUNTER — Other Ambulatory Visit: Payer: Self-pay

## 2012-10-12 MED ORDER — NORGESTIMATE-ETH ESTRADIOL 0.25-35 MG-MCG PO TABS: 1.0000 | ORAL_TABLET | Freq: Every day | ORAL | Status: DC

## 2012-12-21 ENCOUNTER — Other Ambulatory Visit: Payer: Self-pay | Admitting: General Practice

## 2012-12-21 MED ORDER — VENLAFAXINE HCL ER 75 MG PO CP24: 75.0000 mg | ORAL_CAPSULE | Freq: Every day | ORAL | Status: DC

## 2013-01-18 ENCOUNTER — Other Ambulatory Visit: Payer: Self-pay | Admitting: Family Medicine

## 2013-01-18 MED ORDER — ZOLPIDEM TARTRATE 5 MG PO TABS: 5.0000 mg | ORAL_TABLET | Freq: Every evening | ORAL | Status: DC | PRN

## 2013-01-28 ENCOUNTER — Telehealth: Payer: Self-pay

## 2013-01-28 MED ORDER — VALACYCLOVIR HCL 1 G PO TABS: 1000.0000 mg | ORAL_TABLET | Freq: Three times a day (TID) | ORAL | Status: DC

## 2013-01-28 NOTE — Telephone Encounter (Signed)
Call from patient who stated she had shingles. She would like a Rx for Valtrex. Please advise      KP

## 2013-01-28 NOTE — Telephone Encounter (Signed)
Valtrex 1 g 1 po tid for 10 days

## 2013-03-29 ENCOUNTER — Other Ambulatory Visit: Payer: Self-pay | Admitting: General Practice

## 2013-03-29 MED ORDER — NORGESTIMATE-ETH ESTRADIOL 0.25-35 MG-MCG PO TABS: 1.0000 | ORAL_TABLET | Freq: Every day | ORAL | Status: DC

## 2013-05-06 ENCOUNTER — Telehealth: Payer: Self-pay | Admitting: Family Medicine

## 2013-05-06 NOTE — Telephone Encounter (Signed)
Relevant patient education assigned to patient using Emmi. ° °

## 2013-07-05 ENCOUNTER — Telehealth: Payer: Self-pay | Admitting: Family Medicine

## 2013-07-05 NOTE — Telephone Encounter (Signed)
A user error has taken place.

## 2013-07-08 ENCOUNTER — Other Ambulatory Visit: Payer: Self-pay | Admitting: General Practice

## 2013-07-08 MED ORDER — NORGESTIMATE-ETH ESTRADIOL 0.25-35 MG-MCG PO TABS: 1.0000 | ORAL_TABLET | Freq: Every day | ORAL | Status: DC

## 2013-11-02 ENCOUNTER — Telehealth: Payer: Self-pay | Admitting: Family Medicine

## 2013-11-02 MED ORDER — BUPROPION HCL ER (XL) 150 MG PO TB24: 150.0000 mg | ORAL_TABLET | Freq: Every day | ORAL | Status: DC

## 2013-11-02 MED ORDER — ZOLPIDEM TARTRATE 10 MG PO TABS: 10.0000 mg | ORAL_TABLET | Freq: Every evening | ORAL | Status: DC | PRN

## 2013-11-02 NOTE — Telephone Encounter (Signed)
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  Please call in ambirm rx as entered to [facility].  wellbutrin erx sent.  She will keep me updated.

## 2013-11-02 NOTE — Telephone Encounter (Signed)
Rx called in to requested pharmacy 

## 2013-11-05 ENCOUNTER — Other Ambulatory Visit: Payer: Self-pay | Admitting: General Practice

## 2013-11-05 MED ORDER — VORTIOXETINE HBR 20 MG PO TABS: 20.0000 mg | ORAL_TABLET | Freq: Every day | ORAL | Status: DC

## 2014-01-05 ENCOUNTER — Telehealth: Payer: Self-pay | Admitting: Family Medicine

## 2014-01-05 MED ORDER — AMOXICILLIN 500 MG PO CAPS: 1000.0000 mg | ORAL_CAPSULE | Freq: Two times a day (BID) | ORAL | Status: AC

## 2014-01-05 NOTE — Telephone Encounter (Signed)
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 Daughter has had strep throat twice and now [name] has a sore throat. This is appropriate. eRx sent. Call or come to clinic prn if these symptoms worsen or fail to improve as anticipated. The patient indicates understanding of these issues and agrees with the plan.

## 2014-01-14 ENCOUNTER — Telehealth: Payer: Self-pay | Admitting: Family Medicine

## 2014-01-14 MED ORDER — ZOLPIDEM TARTRATE 10 MG PO TABS: 10.0000 mg | ORAL_TABLET | Freq: Every evening | ORAL | Status: DC | PRN

## 2014-01-14 NOTE — Telephone Encounter (Signed)
Pt contacted me asking for refill of ambien.  Uses infrequently.  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  Please refill rx as entered.

## 2014-01-14 NOTE — Telephone Encounter (Signed)
Rx called in to requested pharmacy 

## 2014-01-20 ENCOUNTER — Other Ambulatory Visit: Payer: Self-pay | Admitting: General Practice

## 2014-01-20 MED ORDER — PROMETHAZINE-DM 6.25-15 MG/5ML PO SYRP: 5.0000 mL | ORAL_SOLUTION | Freq: Four times a day (QID) | ORAL | Status: DC | PRN

## 2014-03-16 ENCOUNTER — Telehealth: Payer: Self-pay | Admitting: Family Medicine

## 2014-03-16 ENCOUNTER — Other Ambulatory Visit: Payer: Self-pay | Admitting: General Practice

## 2014-03-16 DIAGNOSIS — K625 Hemorrhage of anus and rectum: Secondary | ICD-10-CM

## 2014-03-16 MED ORDER — VORTIOXETINE HBR 10 MG PO TABS: 10.0000 mg | ORAL_TABLET | Freq: Every day | ORAL | Status: DC

## 2014-03-16 NOTE — Telephone Encounter (Signed)
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  She contacted me with complaint of long term, intermittent rectal bleeding and asked to be referred to Dr. [name].  Referral placed.  Tuesday afternoon re best for her but she is willing to be seen on another day.

## 2014-03-17 ENCOUNTER — Telehealth: Payer: Self-pay | Admitting: *Deleted

## 2014-03-17 NOTE — Telephone Encounter (Signed)
I can see her tomorrow, Friday, after hours, 4.30 if she wants to, she may be late ( 5.00 pm) is OK. Or , I can see her on Monday on my day off- whenever she can make it- after hours is OK. Thanx I am not sure whether complete discretion will ever be possible with [facility] here to stay. !!       Previous Messages       Spoke with patient and she will come tomorrow at 4:30 PM. Scheduled patient.

## 2014-03-18 ENCOUNTER — Ambulatory Visit (INDEPENDENT_AMBULATORY_CARE_PROVIDER_SITE_OTHER): Payer: Self-pay | Admitting: Internal Medicine

## 2014-03-18 ENCOUNTER — Encounter: Payer: Self-pay | Admitting: Internal Medicine

## 2014-03-18 VITALS — BP 90/60 | HR 76 | Ht 66.0 in | Wt 131.2 lb

## 2014-03-18 DIAGNOSIS — K648 Other hemorrhoids: Secondary | ICD-10-CM

## 2014-03-18 DIAGNOSIS — K921 Melena: Secondary | ICD-10-CM

## 2014-03-18 MED ORDER — HYDROCORTISONE ACETATE 25 MG RE SUPP: 25.0000 mg | Freq: Every day | RECTAL | Status: DC

## 2014-03-18 NOTE — Patient Instructions (Signed)
We have sent the following medications to your pharmacy for you to pick up at your convenience: Anusol [facility] suppositories- 1 per rectum every night x 12 nights

## 2014-03-18 NOTE — Progress Notes (Signed)
[name] [birth year] [id number]  Note: This dictation was prepared with Dragon digital system. Any transcriptional errors that result from this procedure are unintentional.   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 There was associated rectal fullness. She gives history of  hemorrhoids with first pregnancy 7 years ago and again with second pregnancy 4 years ago. She had C-section x2.. She has occassional constipation, mostly when she is busy seeing patients and  does not drink enough liquids. There is no family history of colorectal cancer  or inflammatory bowel disease, father has polyps. She never had  rectal surgery.    Past Medical History  Diagnosis Date  . Hemorrhoids   . Menstrual migraine   . Depression     Past Surgical History  Procedure Laterality Date  . Appendectomy    . Cesarean section      x 2  . Mouth surgery      Allergies  Allergen Reactions  . Cefaclor   . Morphine     Family history and social history have been reviewed.  Review of Systems: denies abd.pain,  The remainder of the 10 point ROS is negative except as outlined in the H&P  Physical Exam: General Appearance Well developed, in no distress Eyes  Non icteric  HEENT  Non traumatic, normocephalic  Lungs Clear to auscultation bilaterally COR Normal S1, normal S2, regular rhythm, no murmur, quiet precordium Abdomen scaphoid, soft, normoactive bowel sounds. Liver edge at costal margin. No distention Rectal and anoscopic exam reveals normal perianal area. Normal rectal sphincter tone.  Internal hemorrhoid at 9:00 o'clock with  submucosal blood clot and evidence of recent bleeding. No active bleeding noted.at the time of exam. Small amount of stool is Hemoccult positive. Mucosa of the rectal ampulla appears normal without   evidence of proctitis. 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 She has had mild constipation. She gives prior history of hemorrhoids. Today on the exam there is a first grade but no prolapse and no evidence of fissure. We will treat conservatively with Anusol HC suppositories at bedtime and increase fluid intake as well as  rectal care. If the bleeding recurs or if there are other complications I will recommend banding of the internal hemorrhoid. There are  no risk factors for colon cancer .and  I don't feel that there is an indication for colonoscopy.     [name] 03/18/2014

## 2014-04-04 ENCOUNTER — Telehealth: Payer: Self-pay | Admitting: Family Medicine

## 2014-04-04 DIAGNOSIS — J209 Acute bronchitis, unspecified: Secondary | ICD-10-CM

## 2014-04-04 MED ORDER — AZITHROMYCIN 250 MG PO TABS: ORAL_TABLET | ORAL | Status: DC

## 2014-04-04 NOTE — Telephone Encounter (Signed)
Greater than 10 days of worsening cough, congestion, causing disruption of sleep, SOB and CP. Subjective fevers, chills. RUL rhonchi noted. Will start Azithromycin and probiotics

## 2014-04-14 ENCOUNTER — Telehealth: Payer: Self-pay | Admitting: Family Medicine

## 2014-04-14 MED ORDER — LEVOFLOXACIN 500 MG PO TABS: 500.0000 mg | ORAL_TABLET | Freq: Every day | ORAL | Status: DC

## 2014-04-14 MED ORDER — BENZONATATE 200 MG PO CAPS: 200.0000 mg | ORAL_CAPSULE | Freq: Three times a day (TID) | ORAL | Status: DC | PRN

## 2014-04-14 NOTE — Telephone Encounter (Signed)
Patient with persistent URI symptoms are worsening despite treatment with Azithromycin. Her cough is present and heavy 24/7 productive of thick, green sputum. She is struggling with fevers, malaise, headache and body aches. Will try prescription for Levaquin and Tessalon and she is encouraged to increase rest and hydration. No rhonchi or wheezing noted on exam

## 2014-04-19 ENCOUNTER — Other Ambulatory Visit: Payer: Self-pay | Admitting: General Practice

## 2014-04-19 MED ORDER — NORGESTIMATE-ETH ESTRADIOL 0.25-35 MG-MCG PO TABS: 1.0000 | ORAL_TABLET | Freq: Every day | ORAL | Status: DC

## 2014-08-02 ENCOUNTER — Other Ambulatory Visit: Payer: Self-pay | Admitting: Physician Assistant

## 2014-08-02 MED ORDER — VORTIOXETINE HBR 20 MG PO TABS: 20.0000 mg | ORAL_TABLET | Freq: Every day | ORAL | Status: DC

## 2014-08-02 MED ORDER — SILVER SULFADIAZINE 1 % EX CREA: 1.0000 "application " | TOPICAL_CREAM | Freq: Every day | CUTANEOUS | Status: DC

## 2014-10-11 ENCOUNTER — Telehealth: Payer: Self-pay | Admitting: General Practice

## 2014-10-11 DIAGNOSIS — M67431 Ganglion, right wrist: Secondary | ICD-10-CM

## 2014-10-11 NOTE — Telephone Encounter (Signed)
See below regarding referral and appointment needs.

## 2014-10-11 NOTE — Telephone Encounter (Signed)
Pt called indicating she has a ganglion cyst on the back of her R hand/wrist that is now painful. She is asking for referral to Dr Veronia Beets at [facility] (he is a [employer] manager). Due to her work schedule, Tuesday afternoons would be preferred but will take anything as she may need this taken care of before August.

## 2014-12-30 ENCOUNTER — Telehealth: Payer: Self-pay | Admitting: General Practice

## 2014-12-30 ENCOUNTER — Other Ambulatory Visit: Payer: Self-pay | Admitting: Family Medicine

## 2014-12-30 MED ORDER — VALACYCLOVIR HCL 1 G PO TABS: 1000.0000 mg | ORAL_TABLET | Freq: Three times a day (TID) | ORAL | Status: DC

## 2014-12-30 NOTE — Telephone Encounter (Signed)
Pt called to state she has cluster of very painful vesicles behind L knee. She initially had skin sensitivity w/o rash on upper/outer hip but that dissipated and vesicles appeared. Not itchy. Very tender to touch. Pt is concerned for shingles. Please advise

## 2014-12-30 NOTE — Telephone Encounter (Signed)
Vesicular lesion behind left knee with burning pain, c/w shingles will send in a prescription for Valtrex 1000 mg po tid x 7days

## 2015-01-11 ENCOUNTER — Telehealth: Payer: Self-pay | Admitting: General Practice

## 2015-01-11 MED ORDER — TRETINOIN 0.05 % EX CREA: TOPICAL_CREAM | Freq: Every day | CUTANEOUS | Status: DC

## 2015-01-11 MED ORDER — SUVOREXANT 15 MG PO TABS: 15.0000 mg | ORAL_TABLET | Freq: Every day | ORAL | Status: DC

## 2015-01-11 MED ORDER — VORTIOXETINE HBR 20 MG PO TABS: 20.0000 mg | ORAL_TABLET | Freq: Every day | ORAL | Status: DC

## 2015-01-11 NOTE — Telephone Encounter (Signed)
Trintellix refilled. Take as directed. Will attempt 1 week trial of Belsomra 15 mg nightly. Rx Printed and voucher placed with Rx. If she is not doing well on the increased dose of Belsomra, then we can switch to Trazodone. Retin-A refilled.

## 2015-01-11 NOTE — Telephone Encounter (Signed)
Pt is calling to restart her Brintellix (now Trintellix). She has been off this medication for months but due to recent stressors, she would like to restart at previous dose.   Also, pt is asking to start something for sleep. She is having some difficulty falling asleep but more trouble staying asleep. She knows that some of the symptoms will improve as depression improves but is wondering if she could start something that isn't controlled (Belsomra vs Trazodone)   And lastly, she is asking for a Retinol 0.05% 45 gram tube to improve her facial melasma. She was hoping this would improve after summer ended and she was not in the sun, but this has not occurred.

## 2015-01-11 NOTE — Telephone Encounter (Signed)
Pt informed and expressed an understanding.  

## 2015-01-12 ENCOUNTER — Telehealth: Payer: Self-pay | Admitting: *Deleted

## 2015-01-12 NOTE — Telephone Encounter (Signed)
PA for tretinoin 0.05% cream

## 2015-03-31 ENCOUNTER — Other Ambulatory Visit: Payer: Self-pay | Admitting: Physician Assistant

## 2015-03-31 MED ORDER — ZOLPIDEM TARTRATE 10 MG PO TABS: 5.0000 mg | ORAL_TABLET | Freq: Every evening | ORAL | Status: DC | PRN

## 2015-04-13 ENCOUNTER — Other Ambulatory Visit: Payer: Self-pay | Admitting: Family Medicine

## 2015-04-13 MED ORDER — AMOXICILLIN 500 MG PO TABS: 500.0000 mg | ORAL_TABLET | Freq: Two times a day (BID) | ORAL | Status: DC

## 2015-04-13 MED FILL — AMOXICILLIN 500 MG CAPSULE: 500 | 10 days supply | Qty: 20 | Fill #0

## 2015-04-13 NOTE — Telephone Encounter (Signed)
Antibiotic sent in to [facility] for right ear pain.

## 2015-04-18 DIAGNOSIS — F4321 Adjustment disorder with depressed mood: Secondary | ICD-10-CM | POA: Diagnosis not present

## 2015-05-02 ENCOUNTER — Other Ambulatory Visit: Payer: Self-pay | Admitting: Physician Assistant

## 2015-05-02 DIAGNOSIS — F4321 Adjustment disorder with depressed mood: Secondary | ICD-10-CM | POA: Diagnosis not present

## 2015-05-02 MED ORDER — VORTIOXETINE HBR 20 MG PO TABS: 20.0000 mg | ORAL_TABLET | Freq: Every day | ORAL | Status: DC

## 2015-05-03 MED FILL — TRINTELLIX 20 MG TABLET: 20 | 90 days supply | Qty: 90 | Fill #0

## 2015-06-14 ENCOUNTER — Other Ambulatory Visit: Payer: Self-pay | Admitting: Family Medicine

## 2015-06-14 NOTE — Telephone Encounter (Signed)
Pt has never been seen at this location and does not have an upcoming appt scheduled.

## 2015-06-20 ENCOUNTER — Other Ambulatory Visit: Payer: Self-pay | Admitting: General Practice

## 2015-06-20 MED ORDER — NORGESTIMATE-ETH ESTRADIOL 0.25-35 MG-MCG PO TABS: 1.0000 | ORAL_TABLET | Freq: Every day | ORAL | Status: DC

## 2015-06-20 MED FILL — MONO-LINYAH 28 TABLET: 0.25-35 | 84 days supply | Qty: 84 | Fill #0

## 2015-06-27 ENCOUNTER — Encounter: Payer: Self-pay | Admitting: Physician Assistant

## 2015-06-27 ENCOUNTER — Ambulatory Visit (INDEPENDENT_AMBULATORY_CARE_PROVIDER_SITE_OTHER): Payer: 59 | Admitting: Physician Assistant

## 2015-06-27 VITALS — BP 102/70 | HR 100 | Temp 98.4°F | Resp 16 | Wt 130.0 lb

## 2015-06-27 DIAGNOSIS — J111 Influenza due to unidentified influenza virus with other respiratory manifestations: Secondary | ICD-10-CM

## 2015-06-27 DIAGNOSIS — R509 Fever, unspecified: Secondary | ICD-10-CM

## 2015-06-27 LAB — POCT RAPID STREP A (OFFICE): Rapid Strep A Screen: NEGATIVE

## 2015-06-27 LAB — POCT INFLUENZA A/B
Influenza A, POC: NEGATIVE
Influenza B, POC: NEGATIVE

## 2015-06-27 MED ORDER — PROMETHAZINE-DM 6.25-15 MG/5ML PO SYRP: 5.0000 mL | ORAL_SOLUTION | Freq: Four times a day (QID) | ORAL | Status: DC | PRN

## 2015-06-27 MED ORDER — OSELTAMIVIR PHOSPHATE 75 MG PO CAPS: 75.0000 mg | ORAL_CAPSULE | Freq: Two times a day (BID) | ORAL | Status: DC

## 2015-06-27 MED FILL — OSELTAMIVIR PHOS 75 MG CAP: 75 | 5 days supply | Qty: 10 | Fill #0

## 2015-06-27 MED FILL — PROMETHAZINE-DM SYRUP: 6.25-15 | 6 days supply | Qty: 118 | Fill #0

## 2015-06-27 NOTE — Progress Notes (Signed)
Patient presents to clinic today c/o 1.5 days of fever (Tmax 102), chills, aches, ear pressure, nasal congestion, sore throat and cough. 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 Has had flu shot this year. Denies history of asthma. Is not a smoker. Has been taking OTC medications with little relief in symptoms. Temperature controlled with Ibuprofen.  Past Medical History  Diagnosis Date  . Hemorrhoids   . Menstrual migraine   . Depression     Current Outpatient Prescriptions on File Prior to Visit  Medication Sig Dispense Refill  . norgestimate-ethinyl estradiol (ORTHO-CYCLEN,SPRINTEC,PREVIFEM) 0.25-35 MG-MCG tablet Take 1 tablet by mouth daily. 3 Package 4  . Vortioxetine HBr (BRINTELLIX) 20 MG TABS Take 20 mg by mouth daily. 90 tablet 1   No current facility-administered medications on file prior to visit.    Allergies  Allergen Reactions  . Cefaclor Hives  . Morphine Other (See Comments)    Delayed extabation    Family History  Problem Relation Age of Onset  . Pancreatic cancer Maternal Grandfather   . Parkinson's disease Mother   . Colon polyps Father   . Diabetes Paternal Grandfather   . Heart disease Paternal Grandfather   . Kidney disease Maternal Grandmother     Social History   Social History  . Marital Status: Married    Spouse Name: N/A  . Number of Children: 2  . Years of Education: N/A   Occupational History  . physician    Social History Main Topics  . Smoking status: Never Smoker   . Smokeless tobacco: Never Used  . Alcohol Use: 0.0 oz/week    0 Standard drinks or equivalent per week     Comment: less than 1 per day  . Drug Use: No  . Sexual Activity: Not Asked   Other Topics Concern  . None   Social History Narrative   Review of Systems - See HPI.  All other ROS are negative.  BP 102/70 mmHg  Pulse 100  Temp(Src) 98.4 F (36.9 C) (Oral)  Resp 16  Wt 130 lb (58.968 kg)  SpO2 96%  Physical Exam    Constitutional: She is oriented to person, place, and time and well-developed, well-nourished, and in no distress.  HENT:  Head: Normocephalic and atraumatic.  Right Ear: External ear normal.  Left Ear: External ear normal.  Nose: Nose normal.  Mouth/Throat: Oropharynx is clear and moist. No oropharyngeal exudate.  Eyes: Conjunctivae are normal.  Neck: Neck supple.  Cardiovascular: Normal rate, regular rhythm, normal heart sounds and intact distal pulses.   Pulmonary/Chest: Effort normal and breath sounds normal. No respiratory distress. She has no wheezes. She has no rales. She exhibits no tenderness.  Lymphadenopathy:    She has no cervical adenopathy.  Neurological: She is alert and oriented to person, place, and time.  Skin: Skin is warm and dry. No rash noted.  Psychiatric: Affect normal.  Vitals reviewed.   Recent Results (from the past 2160 hour(s))  POCT Influenza A/B     Status: Normal   Collection Time: 06/27/15 11:26 AM  Result Value Ref Range   Influenza A, POC Negative Negative   Influenza B, POC Negative Negative  POCT rapid strep A     Status: Normal   Collection Time: 06/27/15 11:27 AM  Result Value Ref Range   Rapid Strep A Screen Negative Negative    Assessment/Plan: Flu Symptoms and clinical picture fit a flu-like illness. Rapid test negative but will treat as  influenza. Rx Tamiflu and promethazine-dm. Supportive measures and OTC medications reviewed. Follow-up if symptoms are not resolving.

## 2015-06-27 NOTE — Assessment & Plan Note (Signed)
Symptoms and clinical picture fit a flu-like illness. Rapid test negative but will treat as influenza. Rx Tamiflu and promethazine-dm. Supportive measures and OTC medications reviewed. Follow-up if symptoms are not resolving.

## 2015-06-27 NOTE — Patient Instructions (Signed)
Symptoms are consistent with a flu-like illness. I have sent in a prescription for the Tamiflu to take as directed. I have also sent in a script for the promethazine-dm cough syrup. Increase fluids and rest as much as possible. Continue Ibuprofen for fevers and aches.  If symptoms are not improving with medication and rest or if fever gets above 102 with medicine or not resolving in the next few days, we will need to get a chest x-ray.

## 2015-06-27 NOTE — Progress Notes (Signed)
Pre visit review using our clinic review tool, if applicable. No additional management support is needed unless otherwise documented below in the visit note. 

## 2015-08-08 DIAGNOSIS — H5203 Hypermetropia, bilateral: Secondary | ICD-10-CM | POA: Diagnosis not present

## 2015-08-21 MED FILL — TRINTELLIX 20 MG TABLET: 20 | 90 days supply | Qty: 90 | Fill #1

## 2015-09-08 ENCOUNTER — Encounter: Payer: Self-pay | Admitting: General Practice

## 2015-11-28 ENCOUNTER — Other Ambulatory Visit: Payer: Self-pay | Admitting: General Practice

## 2015-11-28 MED ORDER — VORTIOXETINE HBR 20 MG PO TABS
20.0000 mg | ORAL_TABLET | Freq: Every day | ORAL | 1 refills | Status: DC
Start: 2015-11-28 — End: 2016-05-28

## 2015-11-28 MED FILL — TRINTELLIX 20 MG TABLET: 20 | 90 days supply | Qty: 90 | Fill #0

## 2015-12-18 ENCOUNTER — Telehealth: Payer: Self-pay | Admitting: General Practice

## 2015-12-18 MED ORDER — TRAZODONE HCL 50 MG PO TABS: 25.0000 mg | ORAL_TABLET | Freq: Every evening | ORAL | 3 refills | Status: DC | PRN

## 2015-12-18 MED FILL — traZODone HCL 50 MG TABS: 50 | 60 days supply | Qty: 30 | Fill #0

## 2015-12-18 NOTE — Telephone Encounter (Signed)
Have spoken with patient concerning symptoms. No alarm symptoms present. Will attempt trial of Trazodone 25 mg nightly to help with sleep. Patient will FU to let us know how she is doing. Rx has been sent to pharmacy.

## 2015-12-18 NOTE — Telephone Encounter (Signed)
Noted  

## 2015-12-18 NOTE — Telephone Encounter (Signed)
Pt advised today that she has been experiencing insomnia. She has tried melatonin and Belsomra and neither have helped. She was wondering if she could possibly have a prescription of trazodone sent into her pharmacy for her.   [facility].

## 2016-03-13 MED FILL — TRINTELLIX 20 MG TABLET: 20 | 90 days supply | Qty: 90 | Fill #1

## 2016-05-28 ENCOUNTER — Other Ambulatory Visit: Payer: Self-pay | Admitting: General Practice

## 2016-05-28 MED ORDER — VORTIOXETINE HBR 20 MG PO TABS: 20.0000 mg | ORAL_TABLET | Freq: Every day | ORAL | 1 refills | Status: DC

## 2016-05-28 MED FILL — TRINTELLIX 20 MG TABLET: 20 | 90 days supply | Qty: 90 | Fill #0

## 2016-05-28 NOTE — Telephone Encounter (Signed)
Pt called in today requesting a refill of her trintellix. Medication was last filled 11/28/15 #90 with 1

## 2016-05-28 NOTE — Telephone Encounter (Signed)
Spoke with patient. Doing very well. Medication refilled. Needs to schedule FU.

## 2016-06-26 DIAGNOSIS — Z0100 Encounter for examination of eyes and vision without abnormal findings: Secondary | ICD-10-CM | POA: Diagnosis not present

## 2016-09-16 ENCOUNTER — Other Ambulatory Visit: Payer: Self-pay | Admitting: Emergency Medicine

## 2016-09-16 MED ORDER — VORTIOXETINE HBR 20 MG PO TABS: 20.0000 mg | ORAL_TABLET | Freq: Every day | ORAL | 1 refills | Status: DC

## 2016-09-16 MED FILL — TRINTELLIX 20 MG TABLET: 20 | 90 days supply | Qty: 90 | Fill #0

## 2016-12-13 ENCOUNTER — Ambulatory Visit (INDEPENDENT_AMBULATORY_CARE_PROVIDER_SITE_OTHER): Payer: 59

## 2016-12-13 DIAGNOSIS — Z23 Encounter for immunization: Secondary | ICD-10-CM

## 2016-12-16 ENCOUNTER — Ambulatory Visit: Payer: Self-pay | Admitting: General Surgery

## 2016-12-16 NOTE — H&P (Signed)
History of Present Illness Ralene Ok MD; 12/16/2016 2:46 PM) The patient is a 41 year old female who presents with an umbilical hernia. Referred by: Dr. Birdie Riddle Chief Complaint: Umbilical hernia  Patient is a 41 year old female with a number umbilical hernia which she states is been there for approximately 7.5 years. She states that she does have some tenderness on palpation. She states that she does have some exertional pain there as well. She states it is not reducible. She's had no signs or symptoms of strangulation. Does appear to be incarcerated.    Past Surgical History Erline Levine, RN; 12/16/2016 2:29 PM) Appendectomy  Cesarean Section - Multiple  Foot Surgery  Right. Oral Surgery  Resection of Small Bowel  Shoulder Surgery  Right. Tonsillectomy  Ventral / Umbilical Hernia Surgery  Right.  Diagnostic Studies History Erline Levine, RN; 12/16/2016 2:29 PM) Colonoscopy  never Mammogram  1-3 years ago Pap Smear  1-5 years ago  Allergies Erline Levine, RN; 12/16/2016 2:32 PM) No Known Drug Allergies 12/16/2016 (Marked as Inactive) Cefaclor *CEPHALOSPORINS*  Morphine Sulfate (Concentrate) *ANALGESICS - OPIOID*  Allergies Reconciled   Medication History Erline Levine, RN; 12/16/2016 2:33 PM) Trintellix (20MG  Tablet, Oral daily) Active. ZyrTEC Allergy (10MG  Capsule, Oral daily) Active. Medications Reconciled  Social History Erline Levine, RN; 12/16/2016 2:29 PM) Alcohol use  Occasional alcohol use. Caffeine use  Carbonated beverages. No drug use  Tobacco use  Never smoker.  Family History Erline Levine, RN; 12/16/2016 2:29 PM) Anesthetic complications  Son. Colon Polyps  Father. Depression  Mother. Kidney Disease  Mother. Migraine Headache  Son. Thyroid problems  Father.  Pregnancy / Birth History Erline Levine, RN; 12/16/2016 2:29 PM) Age at menarche  43 years. Gravida  2 Length (months) of breastfeeding  12-24 Maternal age   42-30 Para  2 Regular periods   Other Problems Erline Levine, RN; 12/16/2016 2:29 PM) Anxiety Disorder  Depression  Hemorrhoids  Migraine Headache  Umbilical Hernia Repair  Ventral Hernia Repair     Review of Systems Ralene Ok MD; 12/16/2016 2:45 PM) General Not Present- Appetite Loss, Chills, Fatigue, Fever, Night Sweats, Weight Gain and Weight Loss. Skin Present- Change in Wart/Mole. Not Present- Dryness, Hives, Jaundice, New Lesions, Non-Healing Wounds, Rash and Ulcer. HEENT Present- Seasonal Allergies. Not Present- Earache, Hearing Loss, Hoarseness, Nose Bleed, Oral Ulcers, Ringing in the Ears, Sinus Pain, Sore Throat, Visual Disturbances, Wears glasses/contact lenses and Yellow Eyes. Respiratory Not Present- Bloody sputum, Chronic Cough, Difficulty Breathing, Snoring and Wheezing. Breast Not Present- Breast Mass, Breast Pain, Nipple Discharge and Skin Changes. Cardiovascular Not Present- Chest Pain, Difficulty Breathing Lying Down, Leg Cramps, Palpitations, Rapid Heart Rate, Shortness of Breath and Swelling of Extremities. Gastrointestinal Present- Abdominal Pain. Not Present- Bloating, Bloody Stool, Change in Bowel Habits, Chronic diarrhea, Constipation, Difficulty Swallowing, Excessive gas, Gets full quickly at meals, Hemorrhoids, Indigestion, Nausea, Rectal Pain and Vomiting. Female Genitourinary Not Present- Frequency, Nocturia, Painful Urination, Pelvic Pain and Urgency. Musculoskeletal Present- Joint Pain. Not Present- Back Pain, Joint Stiffness, Muscle Pain, Muscle Weakness and Swelling of Extremities. Neurological Present- Headaches. Not Present- Decreased Memory, Fainting, Numbness, Seizures, Tingling, Tremor, Trouble walking and Weakness. Psychiatric Present- Anxiety. Not Present- Bipolar, Change in Sleep Pattern, Depression, Fearful and Frequent crying. Endocrine Not Present- Cold Intolerance, Excessive Hunger, Hair Changes, Heat Intolerance, Hot flashes and New  Diabetes. Hematology Not Present- Blood Thinners, Easy Bruising, Excessive bleeding, Gland problems, HIV and Persistent Infections. All other systems negative  Vitals Erline Levine RN; 12/16/2016 2:34 PM) 12/16/2016 2:33 PM  Weight: 133.8 lb Height: 66in Body Surface Area: 1.69 m Body Mass Index: 21.6 kg/m  Temp.: 98.93F  Pulse: 79 (Regular)  BP: 120/72 (Sitting, Left Arm, Standard)       Physical Exam Ralene Ok MD; 12/16/2016 2:47 PM) The physical exam findings are as follows: Note:Constitutional: No acute distress, conversant, appears stated age  Eyes: Anicteric sclerae, moist conjunctiva, no lid lag  Neck: No thyromegaly, trachea midline, no cervical lymphadenopathy  Lungs: Clear to auscultation biilaterally, normal respiratory effot  Cardiovascular: regular rate & rhythm, no murmurs, no peripheal edema, pedal pulses 2+  GI: Soft, no masses or hepatosplenomegaly, non-tender to palpation  MSK: Normal gait, no clubbing cyanosis, edema  Skin: No rashes, palpation reveals normal skin turgor  Psychiatric: Appropriate judgment and insight, oriented to person, place, and time  Abdomen Inspection Hernias - Umbilical hernia - Reducible(Small proximally 1 cm supraumbilical hernia).    Assessment & Plan Ralene Ok MD; 7/34/2876 8:11 PM) UMBILICAL HERNIA WITHOUT OBSTRUCTION AND WITHOUT GANGRENE (K42.9) Impression: 41 year old female with a supraumbilical hernia  1. The patient will like to proceed to the operating room for laparoscopic umbilical hernia repair with mesh.  2. I discussed with the patient the signs and symptoms of incarceration and strangulation and the need to proceed to the ER should they occur.  3. I discussed with the patient the risks and benefits of the procedure to include but not limited to: Infection, bleeding, damage to surrounding structures, possible need for further surgery, possible nerve pain, and possible recurrence. The  patient was understanding and wishes to proceed.

## 2016-12-17 DIAGNOSIS — Z01419 Encounter for gynecological examination (general) (routine) without abnormal findings: Secondary | ICD-10-CM | POA: Diagnosis not present

## 2016-12-17 DIAGNOSIS — Z6821 Body mass index (BMI) 21.0-21.9, adult: Secondary | ICD-10-CM | POA: Diagnosis not present

## 2016-12-17 LAB — HM PAP SMEAR: HM PAP: NORMAL

## 2016-12-24 MED FILL — TRINTELLIX 20 MG TABLET: 20 | 90 days supply | Qty: 90 | Fill #1

## 2016-12-31 ENCOUNTER — Encounter (HOSPITAL_COMMUNITY)
Admission: RE | Admit: 2016-12-31 | Discharge: 2016-12-31 | Disposition: A | Discharge status: Home / Self Care | Payer: 59 | Source: Ambulatory Visit | Attending: General Surgery | Admitting: General Surgery

## 2016-12-31 ENCOUNTER — Encounter (HOSPITAL_COMMUNITY): Payer: Self-pay

## 2016-12-31 DIAGNOSIS — K649 Unspecified hemorrhoids: Secondary | ICD-10-CM | POA: Diagnosis not present

## 2016-12-31 DIAGNOSIS — K429 Umbilical hernia without obstruction or gangrene: Secondary | ICD-10-CM | POA: Diagnosis not present

## 2016-12-31 DIAGNOSIS — G43829 Menstrual migraine, not intractable, without status migrainosus: Secondary | ICD-10-CM | POA: Diagnosis not present

## 2016-12-31 DIAGNOSIS — F329 Major depressive disorder, single episode, unspecified: Secondary | ICD-10-CM | POA: Insufficient documentation

## 2016-12-31 DIAGNOSIS — Z01812 Encounter for preprocedural laboratory examination: Secondary | ICD-10-CM | POA: Insufficient documentation

## 2016-12-31 DIAGNOSIS — F419 Anxiety disorder, unspecified: Secondary | ICD-10-CM | POA: Diagnosis not present

## 2016-12-31 HISTORY — DX: Unspecified asthma, uncomplicated: J45.909

## 2016-12-31 HISTORY — DX: Umbilical hernia without obstruction or gangrene: K42.9

## 2016-12-31 HISTORY — DX: Family history of other specified conditions: Z84.89

## 2016-12-31 HISTORY — DX: Gilbert syndrome: E80.4

## 2016-12-31 HISTORY — DX: Other specified postprocedural states: R11.2

## 2016-12-31 HISTORY — DX: Anxiety disorder, unspecified: F41.9

## 2016-12-31 HISTORY — DX: Nausea with vomiting, unspecified: Z98.890

## 2016-12-31 LAB — CBC
HEMATOCRIT: 39.7 % (ref 36.0–46.0)
Hemoglobin: 13 g/dL (ref 12.0–15.0)
MCH: 30.7 pg (ref 26.0–34.0)
MCHC: 32.7 g/dL (ref 30.0–36.0)
MCV: 93.6 fL (ref 78.0–100.0)
PLATELETS: 246 10*3/uL (ref 150–400)
RBC: 4.24 MIL/uL (ref 3.87–5.11)
RDW: 12.7 % (ref 11.5–15.5)
WBC: 7.8 10*3/uL (ref 4.0–10.5)

## 2016-12-31 LAB — COMPREHENSIVE METABOLIC PANEL
ALBUMIN: 4.5 g/dL (ref 3.5–5.0)
ALT: 12 U/L — ABNORMAL LOW (ref 14–54)
ANION GAP: 6 (ref 5–15)
AST: 20 U/L (ref 15–41)
Alkaline Phosphatase: 31 U/L — ABNORMAL LOW (ref 38–126)
BUN: 19 mg/dL (ref 6–20)
CHLORIDE: 106 mmol/L (ref 101–111)
CO2: 24 mmol/L (ref 22–32)
Calcium: 9.2 mg/dL (ref 8.9–10.3)
Creatinine, Ser: 0.77 mg/dL (ref 0.44–1.00)
GFR calc Af Amer: >60 mL/min (ref >60)
GFR calc non Af Amer: >60 mL/min (ref >60)
GLUCOSE: 95 mg/dL (ref 65–99)
POTASSIUM: 3.9 mmol/L (ref 3.5–5.1)
SODIUM: 136 mmol/L (ref 135–145)
Total Bilirubin: 1.2 mg/dL (ref 0.3–1.2)
Total Protein: 7.1 g/dL (ref 6.5–8.1)

## 2016-12-31 LAB — HCG, SERUM, QUALITATIVE: PREG SERUM: NEGATIVE

## 2016-12-31 NOTE — Pre-Procedure Instructions (Signed)
[name]  12/31/2016      [id number] - [place], [place] - [address] [place] [address] Phone: [phone] Fax: [place], [place] [address] Phone: [phone] Fax: [place], [place] [address] Phone: [phone] Fax: [phone]    Your procedure is scheduled on Tuesday, January 07, 2017  Report to [facility] at 5:30 A.M.  Call this number if you have problems the morning of surgery:  [phone]   Remember:  Do not eat food or drink liquids after midnight Monday, January 06, 2017  Take these medicines the morning of surgery with A SIP OF WATER : cetirizine (ZYRTEC), Norethindrone-Ethinyl Estradiol-Fe Biphas (LO LOESTRIN FE),  vortioxetine HBr (TRINTELLIX)  Stop taking Aspirin, vitamins, fish oil and herbal medications. Do not take any NSAIDs ie: Ibuprofen, Advil, Naproxen (Aleve), Motrin,  Excedrin Migraine, BC and Goody Powder ; stop now.  Do not wear jewelry, make-up or nail polish.  Do not wear lotions, powders, or perfumes, or deodorant.  Do not shave 48 hours prior to surgery.    Do not bring valuables to the hospital.  [facility] is not responsible for any belongings or valuables. Contacts, dentures or bridgework may not be worn into surgery.  Leave your suitcase in the car.  After surgery it may be brought to your room. For patients admitted to the hospital, discharge time will be determined by your treatment team. Patients discharged the day of surgery will not be allowed to drive home.   Special instructions: [facility] - Preparing for Surgery  Before surgery, you can play an important role.  Because skin is not sterile, your skin needs to  be as free of germs as possible.  You can reduce the number of germs on you skin by washing with CHG (chlorahexidine gluconate) soap before surgery.  CHG is an antiseptic cleaner which kills germs and bonds with the skin to continue killing germs even after washing.  Please DO NOT use if you have an allergy to CHG or antibacterial soaps.  If your skin becomes reddened/irritated stop using the CHG and inform your nurse when you arrive at Short Stay.  Do not shave (including legs and underarms) for at least 48 hours prior to the first CHG shower.  You may shave your face.  Please follow these instructions carefully:   1.  Shower with CHG Soap the night before surgery and the morning of Surgery.  2.  If you choose to wash your hair, wash your hair first as usual with your normal shampoo.  3.  After you shampoo, rinse your hair and body thoroughly to remove the Shampoo.  4.  Use CHG as you would any other liquid soap.  You can apply chg directly  to the skin and wash gently with scrungie or a clean washcloth.  5.  Apply the CHG Soap to your body ONLY FROM THE NECK DOWN.  Do not use on open wounds or open sores.  Avoid contact with your eyes, ears, mouth and genitals (private parts).  Wash genitals (private parts) with your normal soap.  6.  Wash thoroughly, paying special attention to the area where your surgery will be performed.  7.  Thoroughly rinse your body with warm water from the neck down.  8.  DO NOT shower/wash with your normal soap after using and rinsing off the CHG Soap.  9.  Pat yourself dry with a clean towel.            10.  Wear clean pajamas.            11.  Place clean sheets on your bed the night of your first shower and do not sleep with pets.  Day of Surgery  Do not apply any lotions/deodorants the morning of surgery.  Please wear clean clothes to the hospital/surgery center.  Please read over the following fact sheets that you were given. Pain Booklet, Coughing and Deep  Breathing and Surgical Site Infection Prevention

## 2016-12-31 NOTE — Progress Notes (Signed)
Pt denies SOB, chest pain and being under the care of a cardiologist. Pt denies having a stress test and cardiac cath. Pt denies having recent labs. Pt denies having an EKG and chest x ray within the last year.

## 2017-01-07 ENCOUNTER — Encounter (HOSPITAL_COMMUNITY)
Admission: RE | Disposition: A | Discharge status: Home / Self Care | Payer: Self-pay | Source: Ambulatory Visit | Attending: General Surgery

## 2017-01-07 ENCOUNTER — Ambulatory Visit (HOSPITAL_COMMUNITY): Payer: 59 | Admitting: Certified Registered Nurse Anesthetist

## 2017-01-07 ENCOUNTER — Encounter (HOSPITAL_COMMUNITY): Payer: Self-pay | Admitting: *Deleted

## 2017-01-07 ENCOUNTER — Ambulatory Visit (HOSPITAL_COMMUNITY)
Admission: RE | Admit: 2017-01-07 | Discharge: 2017-01-07 | Disposition: A | Discharge status: Home / Self Care | Payer: 59 | Source: Ambulatory Visit | Attending: General Surgery | Admitting: General Surgery

## 2017-01-07 DIAGNOSIS — F419 Anxiety disorder, unspecified: Secondary | ICD-10-CM | POA: Insufficient documentation

## 2017-01-07 DIAGNOSIS — F53 Postpartum depression: Secondary | ICD-10-CM | POA: Diagnosis not present

## 2017-01-07 DIAGNOSIS — F329 Major depressive disorder, single episode, unspecified: Secondary | ICD-10-CM | POA: Diagnosis not present

## 2017-01-07 DIAGNOSIS — G43909 Migraine, unspecified, not intractable, without status migrainosus: Secondary | ICD-10-CM | POA: Diagnosis not present

## 2017-01-07 DIAGNOSIS — K429 Umbilical hernia without obstruction or gangrene: Secondary | ICD-10-CM | POA: Insufficient documentation

## 2017-01-07 DIAGNOSIS — Z79899 Other long term (current) drug therapy: Secondary | ICD-10-CM | POA: Insufficient documentation

## 2017-01-07 HISTORY — PX: INSERTION OF MESH: SHX5868

## 2017-01-07 HISTORY — PX: UMBILICAL HERNIA REPAIR: SHX196

## 2017-01-07 SURGERY — REPAIR, HERNIA, UMBILICAL, LAPAROSCOPIC
Anesthesia: General | Site: Abdomen

## 2017-01-07 MED ORDER — VANCOMYCIN HCL IN DEXTROSE 1-5 GM/200ML-% IV SOLN
1000.0000 mg | Freq: Once | INTRAVENOUS | Status: AC
  Administered 2017-01-07: 1000 mg via INTRAVENOUS

## 2017-01-07 MED ORDER — DIPHENHYDRAMINE HCL 50 MG/ML IJ SOLN
INTRAMUSCULAR | Status: AC
Start: 2017-01-07 — End: 2017-01-07
  Administered 2017-01-07: 12.5 mg via INTRAVENOUS
  Filled 2017-01-07: qty 1

## 2017-01-07 MED ORDER — CHLORHEXIDINE GLUCONATE CLOTH 2 % EX PADS: 6.0000 | MEDICATED_PAD | Freq: Once | CUTANEOUS | Status: DC

## 2017-01-07 MED ORDER — ROCURONIUM BROMIDE 10 MG/ML (PF) SYRINGE
PREFILLED_SYRINGE | INTRAVENOUS | Status: AC
  Filled 2017-01-07: qty 5

## 2017-01-07 MED ORDER — DEXAMETHASONE SODIUM PHOSPHATE 10 MG/ML IJ SOLN
INTRAMUSCULAR | Status: DC | PRN
  Administered 2017-01-07: 10 mg via INTRAVENOUS

## 2017-01-07 MED ORDER — PHENYLEPHRINE 40 MCG/ML (10ML) SYRINGE FOR IV PUSH (FOR BLOOD PRESSURE SUPPORT)
PREFILLED_SYRINGE | INTRAVENOUS | Status: AC
  Filled 2017-01-07: qty 10

## 2017-01-07 MED ORDER — OXYCODONE-ACETAMINOPHEN 5-325 MG PO TABS: 1.0000 | ORAL_TABLET | Freq: Four times a day (QID) | ORAL | 0 refills | Status: DC | PRN

## 2017-01-07 MED ORDER — PHENYLEPHRINE HCL 10 MG/ML IJ SOLN
INTRAMUSCULAR | Status: DC | PRN
  Administered 2017-01-07 (×2): 80 ug via INTRAVENOUS

## 2017-01-07 MED ORDER — ONDANSETRON HCL 4 MG/2ML IJ SOLN: 4.0000 mg | Freq: Once | INTRAMUSCULAR | Status: DC | PRN

## 2017-01-07 MED ORDER — ACETAMINOPHEN 10 MG/ML IV SOLN
INTRAVENOUS | Status: AC
  Filled 2017-01-07: qty 100

## 2017-01-07 MED ORDER — ACETAMINOPHEN 10 MG/ML IV SOLN
INTRAVENOUS | Status: DC | PRN
  Administered 2017-01-07: 1000 mg via INTRAVENOUS

## 2017-01-07 MED ORDER — CEFAZOLIN SODIUM-DEXTROSE 2-4 GM/100ML-% IV SOLN: 2.0000 g | INTRAVENOUS | Status: DC

## 2017-01-07 MED ORDER — FENTANYL CITRATE (PF) 250 MCG/5ML IJ SOLN
INTRAMUSCULAR | Status: AC
  Filled 2017-01-07: qty 5

## 2017-01-07 MED ORDER — OXYCODONE HCL 5 MG PO TABS
ORAL_TABLET | ORAL | Status: AC
  Filled 2017-01-07: qty 2

## 2017-01-07 MED ORDER — HYDROMORPHONE HCL 1 MG/ML IJ SOLN
0.2500 mg | INTRAMUSCULAR | Status: DC | PRN
  Administered 2017-01-07 (×2): 0.5 mg via INTRAVENOUS

## 2017-01-07 MED ORDER — KETOROLAC TROMETHAMINE 30 MG/ML IJ SOLN
INTRAMUSCULAR | Status: AC
  Filled 2017-01-07: qty 1

## 2017-01-07 MED ORDER — ONDANSETRON HCL 4 MG/2ML IJ SOLN
INTRAMUSCULAR | Status: AC
  Filled 2017-01-07: qty 2

## 2017-01-07 MED ORDER — HYDROMORPHONE HCL 1 MG/ML IJ SOLN
INTRAMUSCULAR | Status: AC
  Filled 2017-01-07: qty 1

## 2017-01-07 MED ORDER — BUPIVACAINE LIPOSOME 1.3 % IJ SUSP
20.0000 mL | INTRAMUSCULAR | Status: AC
  Administered 2017-01-07: 20 mL
  Filled 2017-01-07: qty 20

## 2017-01-07 MED ORDER — OXYCODONE HCL 5 MG PO TABS
5.0000 mg | ORAL_TABLET | ORAL | Status: DC | PRN
  Administered 2017-01-07: 10 mg via ORAL

## 2017-01-07 MED ORDER — DIPHENHYDRAMINE HCL 50 MG/ML IJ SOLN
INTRAMUSCULAR | Status: AC
  Administered 2017-01-07: 12.5 mg via INTRAVENOUS
  Filled 2017-01-07: qty 1

## 2017-01-07 MED ORDER — LIDOCAINE 2% (20 MG/ML) 5 ML SYRINGE
INTRAMUSCULAR | Status: AC
  Filled 2017-01-07: qty 5

## 2017-01-07 MED ORDER — DIPHENHYDRAMINE HCL 50 MG/ML IJ SOLN
12.5000 mg | Freq: Once | INTRAMUSCULAR | Status: AC
  Administered 2017-01-07: 12.5 mg via INTRAVENOUS

## 2017-01-07 MED ORDER — LIDOCAINE 2% (20 MG/ML) 5 ML SYRINGE
INTRAMUSCULAR | Status: DC | PRN
Start: 2017-01-07 — End: 2017-01-07
  Administered 2017-01-07: 100 mg via INTRAVENOUS

## 2017-01-07 MED ORDER — NEOSTIGMINE METHYLSULFATE 5 MG/5ML IV SOSY
PREFILLED_SYRINGE | INTRAVENOUS | Status: AC
  Filled 2017-01-07: qty 5

## 2017-01-07 MED ORDER — FENTANYL CITRATE (PF) 100 MCG/2ML IJ SOLN
INTRAMUSCULAR | Status: DC | PRN
  Administered 2017-01-07: 100 ug via INTRAVENOUS
  Administered 2017-01-07: 50 ug via INTRAVENOUS

## 2017-01-07 MED ORDER — 0.9 % SODIUM CHLORIDE (POUR BTL) OPTIME
TOPICAL | Status: DC | PRN
Start: 2017-01-07 — End: 2017-01-07
  Administered 2017-01-07: 1000 mL

## 2017-01-07 MED ORDER — MIDAZOLAM HCL 5 MG/5ML IJ SOLN
INTRAMUSCULAR | Status: DC | PRN
  Administered 2017-01-07: 2 mg via INTRAVENOUS

## 2017-01-07 MED ORDER — BUPIVACAINE HCL 0.25 % IJ SOLN
INTRAMUSCULAR | Status: DC | PRN
  Administered 2017-01-07: 4 mL

## 2017-01-07 MED ORDER — KETOROLAC TROMETHAMINE 30 MG/ML IJ SOLN
INTRAMUSCULAR | Status: DC | PRN
  Administered 2017-01-07: 30 mg via INTRAVENOUS

## 2017-01-07 MED ORDER — ROCURONIUM BROMIDE 10 MG/ML (PF) SYRINGE
PREFILLED_SYRINGE | INTRAVENOUS | Status: DC | PRN
  Administered 2017-01-07: 40 mg via INTRAVENOUS

## 2017-01-07 MED ORDER — ONDANSETRON HCL 4 MG/2ML IJ SOLN
INTRAMUSCULAR | Status: DC | PRN
  Administered 2017-01-07: 4 mg via INTRAVENOUS

## 2017-01-07 MED ORDER — NEOSTIGMINE METHYLSULFATE 5 MG/5ML IV SOSY
PREFILLED_SYRINGE | INTRAVENOUS | Status: DC | PRN
Start: 2017-01-07 — End: 2017-01-07
  Administered 2017-01-07: 3 mg via INTRAVENOUS

## 2017-01-07 MED ORDER — MEPERIDINE HCL 25 MG/ML IJ SOLN
INTRAMUSCULAR | Status: AC
  Filled 2017-01-07: qty 1

## 2017-01-07 MED ORDER — MEPERIDINE HCL 25 MG/ML IJ SOLN: 6.2500 mg | INTRAMUSCULAR | Status: DC | PRN

## 2017-01-07 MED ORDER — PROPOFOL 10 MG/ML IV BOLUS
INTRAVENOUS | Status: AC
  Filled 2017-01-07: qty 40

## 2017-01-07 MED ORDER — SCOPOLAMINE 1 MG/3DAYS TD PT72
MEDICATED_PATCH | TRANSDERMAL | Status: DC | PRN
  Administered 2017-01-07: 1 via TRANSDERMAL

## 2017-01-07 MED ORDER — BUPIVACAINE HCL (PF) 0.25 % IJ SOLN
INTRAMUSCULAR | Status: AC
  Filled 2017-01-07: qty 30

## 2017-01-07 MED ORDER — ARTIFICIAL TEARS OPHTHALMIC OINT
TOPICAL_OINTMENT | OPHTHALMIC | Status: AC
  Filled 2017-01-07: qty 7

## 2017-01-07 MED ORDER — DEXAMETHASONE SODIUM PHOSPHATE 10 MG/ML IJ SOLN
INTRAMUSCULAR | Status: AC
  Filled 2017-01-07: qty 1

## 2017-01-07 MED ORDER — PROPOFOL 10 MG/ML IV BOLUS
INTRAVENOUS | Status: DC | PRN
  Administered 2017-01-07: 150 mg via INTRAVENOUS

## 2017-01-07 MED ORDER — MIDAZOLAM HCL 2 MG/2ML IJ SOLN
INTRAMUSCULAR | Status: AC
  Filled 2017-01-07: qty 2

## 2017-01-07 MED ORDER — SCOPOLAMINE 1 MG/3DAYS TD PT72
MEDICATED_PATCH | TRANSDERMAL | Status: AC
  Filled 2017-01-07: qty 1

## 2017-01-07 MED ORDER — VANCOMYCIN HCL IN DEXTROSE 1-5 GM/200ML-% IV SOLN
INTRAVENOUS | Status: AC
  Filled 2017-01-07: qty 200

## 2017-01-07 MED ORDER — GLYCOPYRROLATE 0.2 MG/ML IV SOSY
PREFILLED_SYRINGE | INTRAVENOUS | Status: DC | PRN
  Administered 2017-01-07: 0.4 mg via INTRAVENOUS

## 2017-01-07 MED ORDER — SODIUM CHLORIDE 0.9 % IJ SOLN
INTRAMUSCULAR | Status: DC | PRN
  Administered 2017-01-07: 20 mL via INTRAVENOUS

## 2017-01-07 MED ORDER — LACTATED RINGERS IV SOLN
INTRAVENOUS | Status: DC | PRN
  Administered 2017-01-07: 07:00:00 via INTRAVENOUS

## 2017-01-07 SURGICAL SUPPLY — 51 items
APL SKNCLS STERI-STRIP NONHPOA (GAUZE/BANDAGES/DRESSINGS) ×1
APPLIER CLIP LOGIC TI 5 (MISCELLANEOUS) IMPLANT
APPLIER CLIP ROT 10 11.4 M/L (STAPLE)
APR CLP MED LRG 11.4X10 (STAPLE)
APR CLP MED LRG 33X5 (MISCELLANEOUS)
BENZOIN TINCTURE PRP APPL 2/3 (GAUZE/BANDAGES/DRESSINGS) ×2 IMPLANT
CHLORAPREP W/TINT 26ML (MISCELLANEOUS) ×2 IMPLANT
CLIP APPLIE ROT 10 11.4 M/L (STAPLE) IMPLANT
COVER SURGICAL LIGHT HANDLE (MISCELLANEOUS) ×2 IMPLANT
DEVICE SECURE STRAP 25 ABSORB (INSTRUMENTS) ×2 IMPLANT
ELECT REM PT RETURN 9FT ADLT (ELECTROSURGICAL) ×2
ELECTRODE REM PT RTRN 9FT ADLT (ELECTROSURGICAL) ×1 IMPLANT
GAUZE SPONGE 2X2 8PLY STRL LF (GAUZE/BANDAGES/DRESSINGS) ×1 IMPLANT
GLOVE BIO SURGEON STRL SZ7.5 (GLOVE) ×2 IMPLANT
GLOVE BIOGEL PI IND STRL 6 (GLOVE) IMPLANT
GLOVE BIOGEL PI INDICATOR 6 (GLOVE) ×1
GLOVE ECLIPSE 6.0 STRL STRAW (GLOVE) ×1 IMPLANT
GLOVE ECLIPSE 8.0 STRL XLNG CF (GLOVE) ×1 IMPLANT
GOWN STRL REUS W/ TWL LRG LVL3 (GOWN DISPOSABLE) ×2 IMPLANT
GOWN STRL REUS W/ TWL XL LVL3 (GOWN DISPOSABLE) ×1 IMPLANT
GOWN STRL REUS W/TWL LRG LVL3 (GOWN DISPOSABLE) ×2
GOWN STRL REUS W/TWL XL LVL3 (GOWN DISPOSABLE) ×4
GRASPER SUT TROCAR 14GX15 (MISCELLANEOUS) ×2 IMPLANT
KIT BASIN OR (CUSTOM PROCEDURE TRAY) ×2 IMPLANT
KIT ROOM TURNOVER OR (KITS) ×2 IMPLANT
MARKER SKIN DUAL TIP RULER LAB (MISCELLANEOUS) ×2 IMPLANT
MESH VENTRALIGHT ST 4.5IN (Mesh General) ×1 IMPLANT
NDL INSUFFLATION 14GA 120MM (NEEDLE) ×1 IMPLANT
NDL SPNL 22GX3.5 QUINCKE BK (NEEDLE) IMPLANT
NEEDLE INSUFFLATION 14GA 120MM (NEEDLE) ×2 IMPLANT
NEEDLE SPNL 22GX3.5 QUINCKE BK (NEEDLE) ×2 IMPLANT
NS IRRIG 1000ML POUR BTL (IV SOLUTION) ×2 IMPLANT
PAD ARMBOARD 7.5X6 YLW CONV (MISCELLANEOUS) ×4 IMPLANT
SCISSORS LAP 5X35 DISP (ENDOMECHANICALS) ×2 IMPLANT
SET IRRIG TUBING LAPAROSCOPIC (IRRIGATION / IRRIGATOR) IMPLANT
SLEEVE ENDOPATH XCEL 5M (ENDOMECHANICALS) ×1 IMPLANT
SPONGE GAUZE 2X2 STER 10/PKG (GAUZE/BANDAGES/DRESSINGS) ×1
STRIP CLOSURE SKIN 1/2X4 (GAUZE/BANDAGES/DRESSINGS) ×2 IMPLANT
SUT CHROMIC 2 0 SH (SUTURE) ×2 IMPLANT
SUT ETHIBOND 0 MO6 C/R (SUTURE) IMPLANT
SUT MNCRL AB 4-0 PS2 18 (SUTURE) ×2 IMPLANT
SUT NOVA 1 T20/GS 25DT (SUTURE) IMPLANT
SUT PROLENE 2 0 KS (SUTURE) IMPLANT
TAPE PAPER 2X10 WHT MICROPORE (GAUZE/BANDAGES/DRESSINGS) ×1 IMPLANT
TOWEL OR 17X24 6PK STRL BLUE (TOWEL DISPOSABLE) ×2 IMPLANT
TOWEL OR 17X26 10 PK STRL BLUE (TOWEL DISPOSABLE) ×2 IMPLANT
TRAY LAPAROSCOPIC MC (CUSTOM PROCEDURE TRAY) ×2 IMPLANT
TROCAR XCEL BLUNT TIP 100MML (ENDOMECHANICALS) IMPLANT
TROCAR XCEL NON-BLD 11X100MML (ENDOMECHANICALS) ×1 IMPLANT
TROCAR XCEL NON-BLD 5MMX100MML (ENDOMECHANICALS) ×2 IMPLANT
TUBING INSUFFLATION (TUBING) ×2 IMPLANT

## 2017-01-07 NOTE — Anesthesia Preprocedure Evaluation (Signed)
Anesthesia Evaluation  Patient identified by MRN, date of birth, ID band Patient awake    Reviewed: Allergy & Precautions, NPO status , Patient's Chart, lab work & pertinent test results  History of Anesthesia Complications (+) PONV  Airway Mallampati: I  TM Distance: >3 FB Neck ROM: Full    Dental   Pulmonary asthma ,    Pulmonary exam normal        Cardiovascular Normal cardiovascular exam     Neuro/Psych Anxiety Depression    GI/Hepatic   Endo/Other    Renal/GU      Musculoskeletal   Abdominal   Peds  Hematology   Anesthesia Other Findings   Reproductive/Obstetrics                             Anesthesia Physical Anesthesia Plan  ASA: II  Anesthesia Plan: General   Post-op Pain Management:    Induction: Intravenous  PONV Risk Score and Plan: 4 or greater and Ondansetron, Dexamethasone, Midazolam and Treatment may vary due to age or medical condition  Airway Management Planned: Oral ETT  Additional Equipment:   Intra-op Plan:   Post-operative Plan: Extubation in OR  Informed Consent: I have reviewed the patients History and Physical, chart, labs and discussed the procedure including the risks, benefits and alternatives for the proposed anesthesia with the patient or authorized representative who has indicated his/her understanding and acceptance.     Plan Discussed with: CRNA and Surgeon  Anesthesia Plan Comments:         Anesthesia Quick Evaluation

## 2017-01-07 NOTE — Interval H&P Note (Signed)
History and Physical Interval Note:  01/07/2017 7:14 AM  [name]  has presented today for surgery, with the diagnosis of umbilical hernia  The various methods of treatment have been discussed with the patient and family. After consideration of risks, benefits and other options for treatment, the patient has consented to  Procedure(s): [place] (N/A) INSERTION OF MESH (N/A) as a surgical intervention .  The patient's history has been reviewed, patient examined, no change in status, stable for surgery.  I have reviewed the patient's chart and labs.  Questions were answered to the patient's satisfaction.     Rosario Jacks., Anne Hahn

## 2017-01-07 NOTE — Progress Notes (Signed)
Report given to erika rn as caregiver 

## 2017-01-07 NOTE — H&P (View-Only) (Signed)
History of Present Illness Ralene Ok MD; 12/16/2016 2:46 PM) The patient is a 41 year old female who presents with an umbilical hernia. Referred by: Dr. Birdie Riddle Chief Complaint: Umbilical hernia  Patient is a 41 year old female with a number umbilical hernia which she states is been there for approximately 7.5 years. She states that she does have some tenderness on palpation. She states that she does have some exertional pain there as well. She states it is not reducible. She's had no signs or symptoms of strangulation. Does appear to be incarcerated.    Past Surgical History Erline Levine, RN; 12/16/2016 2:29 PM) Appendectomy  Cesarean Section - Multiple  Foot Surgery  Right. Oral Surgery  Resection of Small Bowel  Shoulder Surgery  Right. Tonsillectomy  Ventral / Umbilical Hernia Surgery  Right.  Diagnostic Studies History Erline Levine, RN; 12/16/2016 2:29 PM) Colonoscopy  never Mammogram  1-3 years ago Pap Smear  1-5 years ago  Allergies Erline Levine, RN; 12/16/2016 2:32 PM) No Known Drug Allergies 12/16/2016 (Marked as Inactive) Cefaclor *CEPHALOSPORINS*  Morphine Sulfate (Concentrate) *ANALGESICS - OPIOID*  Allergies Reconciled   Medication History Erline Levine, RN; 12/16/2016 2:33 PM) Trintellix (20MG  Tablet, Oral daily) Active. ZyrTEC Allergy (10MG  Capsule, Oral daily) Active. Medications Reconciled  Social History Erline Levine, RN; 12/16/2016 2:29 PM) Alcohol use  Occasional alcohol use. Caffeine use  Carbonated beverages. No drug use  Tobacco use  Never smoker.  Family History Erline Levine, RN; 12/16/2016 2:29 PM) Anesthetic complications  Son. Colon Polyps  Father. Depression  Mother. Kidney Disease  Mother. Migraine Headache  Son. Thyroid problems  Father.  Pregnancy / Birth History Erline Levine, RN; 12/16/2016 2:29 PM) Age at menarche  47 years. Gravida  2 Length (months) of breastfeeding  12-24 Maternal age   50-30 Para  2 Regular periods   Other Problems Erline Levine, RN; 12/16/2016 2:29 PM) Anxiety Disorder  Depression  Hemorrhoids  Migraine Headache  Umbilical Hernia Repair  Ventral Hernia Repair     Review of Systems Ralene Ok MD; 12/16/2016 2:45 PM) General Not Present- Appetite Loss, Chills, Fatigue, Fever, Night Sweats, Weight Gain and Weight Loss. Skin Present- Change in Wart/Mole. Not Present- Dryness, Hives, Jaundice, New Lesions, Non-Healing Wounds, Rash and Ulcer. HEENT Present- Seasonal Allergies. Not Present- Earache, Hearing Loss, Hoarseness, Nose Bleed, Oral Ulcers, Ringing in the Ears, Sinus Pain, Sore Throat, Visual Disturbances, Wears glasses/contact lenses and Yellow Eyes. Respiratory Not Present- Bloody sputum, Chronic Cough, Difficulty Breathing, Snoring and Wheezing. Breast Not Present- Breast Mass, Breast Pain, Nipple Discharge and Skin Changes. Cardiovascular Not Present- Chest Pain, Difficulty Breathing Lying Down, Leg Cramps, Palpitations, Rapid Heart Rate, Shortness of Breath and Swelling of Extremities. Gastrointestinal Present- Abdominal Pain. Not Present- Bloating, Bloody Stool, Change in Bowel Habits, Chronic diarrhea, Constipation, Difficulty Swallowing, Excessive gas, Gets full quickly at meals, Hemorrhoids, Indigestion, Nausea, Rectal Pain and Vomiting. Female Genitourinary Not Present- Frequency, Nocturia, Painful Urination, Pelvic Pain and Urgency. Musculoskeletal Present- Joint Pain. Not Present- Back Pain, Joint Stiffness, Muscle Pain, Muscle Weakness and Swelling of Extremities. Neurological Present- Headaches. Not Present- Decreased Memory, Fainting, Numbness, Seizures, Tingling, Tremor, Trouble walking and Weakness. Psychiatric Present- Anxiety. Not Present- Bipolar, Change in Sleep Pattern, Depression, Fearful and Frequent crying. Endocrine Not Present- Cold Intolerance, Excessive Hunger, Hair Changes, Heat Intolerance, Hot flashes and New  Diabetes. Hematology Not Present- Blood Thinners, Easy Bruising, Excessive bleeding, Gland problems, HIV and Persistent Infections. All other systems negative  Vitals Erline Levine RN; 12/16/2016 2:34 PM) 12/16/2016 2:33 PM  Weight: 133.8 lb Height: 66in Body Surface Area: 1.69 m Body Mass Index: 21.6 kg/m  Temp.: 98.68F  Pulse: 79 (Regular)  BP: 120/72 (Sitting, Left Arm, Standard)       Physical Exam Ralene Ok MD; 12/16/2016 2:47 PM) The physical exam findings are as follows: Note:Constitutional: No acute distress, conversant, appears stated age  Eyes: Anicteric sclerae, moist conjunctiva, no lid lag  Neck: No thyromegaly, trachea midline, no cervical lymphadenopathy  Lungs: Clear to auscultation biilaterally, normal respiratory effot  Cardiovascular: regular rate & rhythm, no murmurs, no peripheal edema, pedal pulses 2+  GI: Soft, no masses or hepatosplenomegaly, non-tender to palpation  MSK: Normal gait, no clubbing cyanosis, edema  Skin: No rashes, palpation reveals normal skin turgor  Psychiatric: Appropriate judgment and insight, oriented to person, place, and time  Abdomen Inspection Hernias - Umbilical hernia - Reducible(Small proximally 1 cm supraumbilical hernia).    Assessment & Plan Ralene Ok MD; 7/51/0258 5:27 PM) UMBILICAL HERNIA WITHOUT OBSTRUCTION AND WITHOUT GANGRENE (K42.9) Impression: 41 year old female with a supraumbilical hernia  1. The patient will like to proceed to the operating room for laparoscopic umbilical hernia repair with mesh.  2. I discussed with the patient the signs and symptoms of incarceration and strangulation and the need to proceed to the ER should they occur.  3. I discussed with the patient the risks and benefits of the procedure to include but not limited to: Infection, bleeding, damage to surrounding structures, possible need for further surgery, possible nerve pain, and possible recurrence. The  patient was understanding and wishes to proceed.

## 2017-01-07 NOTE — Anesthesia Postprocedure Evaluation (Signed)
Anesthesia Post Note  Patient: [name]  Procedure(s) Performed: LAPAROSCOPIC UMBILICAL HERNIA REPAIR WITH MESH (N/A Abdomen) INSERTION OF MESH (N/A Abdomen)     Anesthesia Post Evaluation  Last Vitals:  Vitals:   01/07/17 1045 01/07/17 1115  BP: 107/66 137/66  Pulse: 77 84  Resp: 13 12  Temp:    SpO2: 99% 98%    Last Pain:  Vitals:   01/07/17 1115  TempSrc:   PainSc: 0-No pain                 Cicily Bonano DAVID

## 2017-01-07 NOTE — Discharge Instructions (Signed)
[facility] _______[facility] ° °UMBILICAL  HERNIA REPAIR: POST OP INSTRUCTIONS ° °Always review your discharge instruction sheet given to you by the facility where your surgery was performed. °IF YOU HAVE DISABILITY OR FAMILY LEAVE FORMS, YOU MUST BRING THEM TO THE OFFICE FOR PROCESSING.   °DO NOT GIVE THEM TO YOUR DOCTOR. ° °1. A  prescription for pain medication may be given to you upon discharge.  Take your pain medication as prescribed, if needed.  If narcotic pain medicine is not needed, then you may take acetaminophen (Tylenol) or ibuprofen (Advil) as needed. °2. Take your usually prescribed medications unless otherwise directed. °If you need a refill on your pain medication, please contact your pharmacy.  They will contact our office to request authorization. Prescriptions will not be filled after 5 pm or on week-ends. °3. You should follow a light diet the first 24 hours after arrival home, such as soup and crackers, etc.  Be sure to include lots of fluids daily.  Resume your normal diet the day after surgery. °4.Most patients will experience some swelling and bruising around the umbilicus or in the groin and scrotum.  Ice packs and reclining will help.  Swelling and bruising can take several days to resolve.  °6. It is common to experience some constipation if taking pain medication after surgery.  Increasing fluid intake and taking a stool softener (such as Colace) will usually help or prevent this problem from occurring.  A mild laxative (Milk of Magnesia or Miralax) should be taken according to package directions if there are no bowel movements after 48 hours. °7. Unless discharge instructions indicate otherwise, you may remove your bandages 24-48 hours after surgery, and you may shower at that time.  You may have steri-strips (small skin tapes) in place directly over the incision.  These strips should be left on the skin for 7-10 days.  If your surgeon used skin glue on the incision, you may  shower in 24 hours.  The glue will flake off over the next 2-3 weeks.  Any sutures or staples will be removed at the office during your follow-up visit. °8. ACTIVITIES:  You may resume regular (light) daily activities beginning the next day--such as daily self-care, walking, climbing stairs--gradually increasing activities as tolerated.  You may have sexual intercourse when it is comfortable.  Refrain from any heavy lifting or straining until approved by your doctor. ° °a.You may drive when you are no longer taking prescription pain medication, you can comfortably wear a seatbelt, and you can safely maneuver your car and apply brakes. °b.RETURN TO WORK:   °_____________________________________________ ° °9.You should see your doctor in the office for a follow-up appointment approximately 2-3 weeks after your surgery.  Make sure that you call for this appointment within a day or two after you arrive home to insure a convenient appointment time. °10.OTHER INSTRUCTIONS: _________________________ °   _____________________________________ ° °WHEN TO CALL YOUR DOCTOR: °1. Fever over 101.0 °2. Inability to urinate °3. Nausea and/or vomiting °4. Extreme swelling or bruising °5. Continued bleeding from incision. °6. Increased pain, redness, or drainage from the incision ° °The clinic staff is available to answer your questions during regular business hours.  Please don’t hesitate to call and ask to speak to one of the nurses for clinical concerns.  If you have a medical emergency, go to the nearest emergency room or call 911.  A surgeon from [facility] is always on call at the hospital ° ° °[address], [address], [place], [place]  [address] ? ° [address] [address], [place], [place]   [address] °[phone] ? [phone] ? FAX [phone] °Web site: [email or web address] ° °

## 2017-01-07 NOTE — Transfer of Care (Signed)
Immediate Anesthesia Transfer of Care Note  Patient: [name]  Procedure(s) Performed: LAPAROSCOPIC UMBILICAL HERNIA REPAIR WITH MESH (N/A Abdomen) INSERTION OF MESH (N/A Abdomen)  Patient Location: PACU  Anesthesia Type:General  Level of Consciousness: patient cooperative and responds to stimulation  Airway & Oxygen Therapy: Patient Spontanous Breathing and Patient connected to nasal cannula oxygen  Post-op Assessment: Report given to RN and Post -op Vital signs reviewed and stable  Post vital signs: Reviewed and stable  Last Vitals:  Vitals:   01/07/17 0616  BP: (!) 100/47  Pulse: 67  Resp: 18  Temp: 36.6 C  SpO2: 99%    Last Pain:  Vitals:   01/07/17 0616  TempSrc: Oral      Patients Stated Pain Goal: 6 (56/38/75 6433)  Complications: No apparent anesthesia complications

## 2017-01-07 NOTE — Op Note (Signed)
01/07/2017  8:14 AM  PATIENT:  [name]  41 y.o. female  PRE-OPERATIVE DIAGNOSIS:  umbilical hernia  POST-OPERATIVE DIAGNOSIS:  umbilical hernia  PROCEDURE:  Procedure(s): LAPAROSCOPIC UMBILICAL HERNIA REPAIR WITH MESH (N/A) INSERTION OF MESH (N/A)  SURGEON:  Surgeon(s) and Role:    * Ralene Ok, MD - Primary  ASSISTANTS: none   ANESTHESIA:   local and general  EBL:  <5cc  BLOOD ADMINISTERED:none  DRAINS: none   LOCAL MEDICATIONS USED:  BUPIVICAINE  and OTHER exparil  SPECIMEN:  No Specimen  DISPOSITION OF SPECIMEN:  N/A  COUNTS:  YES  TOURNIQUET:  * No tourniquets in log *  DICTATION: .Dragon Dictation   Details of the procedure:   After the patient was consented patient was taken back to the operating room patient was then placed in supine position bilateral SCDs in place.  The patient was prepped and draped in the usual sterile fashion. After antibiotics were confirmed a timeout was called and all facts were verified. The Veress needle technique was used to insuflate the abdomen at Palmer's point. The abdomen was insufflated to 14 mm mercury. Subsequently a 5 mm trocar was placed a camera inserted there was no injury to any intra-abdominal organs.    There was seen to be a non-incarcerated  0.5 cm umbilcal hernia.  A second camera port was in placed into the left lower quadrant.   At this the Falicform ligament was taken down with Bovie cautery maintaining hemostasis.  I proceeded to reduce the hernia contents.  Once the hernia was cleared away, a Bard Ventralight 11.4cm  mesh was inserted into the abdomen.  The mesh was secured circumferentially with am Securestrap tacker in a double crown fashion.     The omentum was brought over the area of the mesh. The pneumoperitoneum was evacuated  & all trocars  were removed. The skin was reapproximated with 4-0  Monocryl sutures in a subcuticular fashion. The skin was dressed with Steri-Strips tape and gauze.   The patient was taken to the recovery room in stable condition.   PLAN OF CARE: Discharge to home after PACU  PATIENT DISPOSITION:  PACU - hemodynamically stable.   Delay start of Pharmacological VTE agent (>24hrs) due to surgical blood loss or risk of bleeding: not applicable

## 2017-01-08 ENCOUNTER — Encounter (HOSPITAL_COMMUNITY): Payer: Self-pay | Admitting: General Surgery

## 2017-01-11 ENCOUNTER — Emergency Department (HOSPITAL_COMMUNITY)
Admission: EM | Admit: 2017-01-11 | Discharge: 2017-01-11 | Disposition: A | Discharge status: Home / Self Care | Payer: 59 | Attending: Emergency Medicine | Admitting: Emergency Medicine

## 2017-01-11 ENCOUNTER — Emergency Department (HOSPITAL_COMMUNITY): Payer: 59

## 2017-01-11 ENCOUNTER — Encounter (HOSPITAL_COMMUNITY): Payer: Self-pay | Admitting: Emergency Medicine

## 2017-01-11 DIAGNOSIS — K429 Umbilical hernia without obstruction or gangrene: Secondary | ICD-10-CM | POA: Diagnosis not present

## 2017-01-11 DIAGNOSIS — R1084 Generalized abdominal pain: Secondary | ICD-10-CM | POA: Diagnosis not present

## 2017-01-11 DIAGNOSIS — Z79899 Other long term (current) drug therapy: Secondary | ICD-10-CM | POA: Diagnosis not present

## 2017-01-11 DIAGNOSIS — G8918 Other acute postprocedural pain: Secondary | ICD-10-CM | POA: Diagnosis not present

## 2017-01-11 DIAGNOSIS — J45909 Unspecified asthma, uncomplicated: Secondary | ICD-10-CM | POA: Diagnosis not present

## 2017-01-11 DIAGNOSIS — R1031 Right lower quadrant pain: Secondary | ICD-10-CM | POA: Diagnosis present

## 2017-01-11 LAB — CBC WITH DIFFERENTIAL/PLATELET
BASOS PCT: 0 %
Basophils Absolute: 0 10*3/uL (ref 0.0–0.1)
Eosinophils Absolute: 0.3 10*3/uL (ref 0.0–0.7)
Eosinophils Relative: 5 %
HEMATOCRIT: 37 % (ref 36.0–46.0)
HEMOGLOBIN: 12.3 g/dL (ref 12.0–15.0)
Lymphocytes Relative: 27 %
Lymphs Abs: 1.9 10*3/uL (ref 0.7–4.0)
MCH: 30.9 pg (ref 26.0–34.0)
MCHC: 33.2 g/dL (ref 30.0–36.0)
MCV: 93 fL (ref 78.0–100.0)
MONOS PCT: 7 %
Monocytes Absolute: 0.5 10*3/uL (ref 0.1–1.0)
NEUTROS ABS: 4.1 10*3/uL (ref 1.7–7.7)
NEUTROS PCT: 61 %
Platelets: 220 10*3/uL (ref 150–400)
RBC: 3.98 MIL/uL (ref 3.87–5.11)
RDW: 12.6 % (ref 11.5–15.5)
WBC: 6.8 10*3/uL (ref 4.0–10.5)

## 2017-01-11 LAB — URINALYSIS, ROUTINE W REFLEX MICROSCOPIC
Bilirubin Urine: NEGATIVE
GLUCOSE, UA: NEGATIVE mg/dL
HGB URINE DIPSTICK: NEGATIVE
KETONES UR: NEGATIVE mg/dL
Leukocytes, UA: NEGATIVE
Nitrite: NEGATIVE
PH: 6 (ref 5.0–8.0)
PROTEIN: NEGATIVE mg/dL
Specific Gravity, Urine: 1.02 (ref 1.005–1.030)

## 2017-01-11 LAB — I-STAT CG4 LACTIC ACID, ED: Lactic Acid, Venous: 0.8 mmol/L (ref 0.5–1.9)

## 2017-01-11 LAB — COMPREHENSIVE METABOLIC PANEL
ALBUMIN: 3.7 g/dL (ref 3.5–5.0)
ALK PHOS: 28 U/L — AB (ref 38–126)
ALT: 15 U/L (ref 14–54)
ANION GAP: 10 (ref 5–15)
AST: 20 U/L (ref 15–41)
BILIRUBIN TOTAL: 1.2 mg/dL (ref 0.3–1.2)
BUN: 15 mg/dL (ref 6–20)
CALCIUM: 8.5 mg/dL — AB (ref 8.9–10.3)
CO2: 25 mmol/L (ref 22–32)
CREATININE: 0.8 mg/dL (ref 0.44–1.00)
Chloride: 102 mmol/L (ref 101–111)
GFR calc Af Amer: >60 mL/min (ref >60)
GFR calc non Af Amer: >60 mL/min (ref >60)
Glucose, Bld: 102 mg/dL — ABNORMAL HIGH (ref 65–99)
Potassium: 3.6 mmol/L (ref 3.5–5.1)
Sodium: 137 mmol/L (ref 135–145)
TOTAL PROTEIN: 6.6 g/dL (ref 6.5–8.1)

## 2017-01-11 MED ORDER — HYDROMORPHONE HCL 1 MG/ML IJ SOLN
1.0000 mg | Freq: Once | INTRAMUSCULAR | Status: AC
  Administered 2017-01-11: 1 mg via INTRAVENOUS
  Filled 2017-01-11: qty 1

## 2017-01-11 MED ORDER — ONDANSETRON HCL 4 MG/2ML IJ SOLN
4.0000 mg | Freq: Once | INTRAMUSCULAR | Status: AC
  Administered 2017-01-11: 4 mg via INTRAVENOUS
  Filled 2017-01-11: qty 2

## 2017-01-11 MED ORDER — POLYETHYLENE GLYCOL 3350 17 G PO PACK: 17.0000 g | PACK | Freq: Every day | ORAL | 0 refills | Status: DC

## 2017-01-11 MED ORDER — IOPAMIDOL (ISOVUE-300) INJECTION 61%: 15.0000 mL | INTRAVENOUS | Status: AC

## 2017-01-11 MED ORDER — OXYCODONE-ACETAMINOPHEN 5-325 MG PO TABS: 1.0000 | ORAL_TABLET | ORAL | 0 refills | Status: DC | PRN

## 2017-01-11 MED ORDER — IOPAMIDOL (ISOVUE-300) INJECTION 61%
INTRAVENOUS | Status: AC
  Administered 2017-01-11: 100 mL
  Filled 2017-01-11: qty 100

## 2017-01-11 MED ORDER — GABAPENTIN 300 MG PO CAPS: 300.0000 mg | ORAL_CAPSULE | Freq: Three times a day (TID) | ORAL | 0 refills | Status: DC

## 2017-01-11 NOTE — ED Triage Notes (Signed)
Pt is on post surgery day 4 for a hernia repair, started having severe pain last nigh no relief with prescribed medication. Having some nausea with pain no fever.

## 2017-01-11 NOTE — ED Provider Notes (Signed)
[place] DEPT Provider Note   CSN: [id number] Arrival date & time: 01/11/17  6269     History   Chief Complaint Chief Complaint  Patient presents with  . Post-op Problem    HPI [name] is a 41 y.o. female.  The history is provided by the patient. No language interpreter was used.  Abdominal Pain   This is a new problem. The current episode started 2 days ago. The problem occurs constantly. The problem has been gradually worsening. The pain is associated with a previous surgery. The pain is located in the RUQ. The pain is moderate. Associated symptoms include nausea. Pertinent negatives include fever. Nothing aggravates the symptoms. Nothing relieves the symptoms. Past workup includes surgery. Her past medical history does not include irritable bowel syndrome.  Pt complains of pain in her right upper quadrant.  Pt had a mesh hernia repair on 10/2.  Pt reports pain last pm, more severe today.  No relief with percocet.  Past Medical History:  Diagnosis Date  . Anxiety   . Asthma    PMH: exercise induced as a child  . Depression   . Family history of adverse reaction to anesthesia    Son PONV  . Gilberts syndrome   . Hemorrhoids   . Menstrual migraine    menstrual migraines  . PONV (postoperative nausea and vomiting)   . Umbilical hernia     Patient Active Problem List   Diagnosis Date Noted  . Flu 06/27/2015  . Bruising 08/07/2010  . MIGRAINE HEADACHE 05/08/2010  . POSTPARTUM DEPRESSION 09/26/2009    Past Surgical History:  Procedure Laterality Date  . ANKLE SURGERY    . APPENDECTOMY    . CESAREAN SECTION     x 2  . GANGLION CYST EXCISION     X 2  . HERNIA REPAIR     VENTRAL  . INSERTION OF MESH N/A 01/07/2017   Procedure: INSERTION OF MESH;  Surgeon: Ralene Ok, MD;  Location: [facility];  Service: General;  Laterality: N/A;  . INTUSSUSCEPTION REPAIR    . MOUTH SURGERY    . SHOULDER ARTHROSCOPY     x 2 right  . TONSILLECTOMY AND  ADENOIDECTOMY    . TUBAL LIGATION    . TYMPANOSTOMY TUBE PLACEMENT    . UMBILICAL HERNIA REPAIR N/A 01/07/2017   Procedure: LAPAROSCOPIC UMBILICAL HERNIA REPAIR WITH MESH;  Surgeon: Ralene Ok, MD;  Location: [facility];  Service: General;  Laterality: N/A;  . WISDOM TOOTH EXTRACTION      OB History    No data available       Home Medications    Prior to Admission medications   Medication Sig Start Date End Date Taking? Authorizing Provider  aspirin-acetaminophen-caffeine (EXCEDRIN MIGRAINE) [phone] MG tablet Take 2 tablets by mouth daily as needed for headache or migraine.   Yes [provider]  cetirizine (ZYRTEC) 10 MG tablet Take 10 mg by mouth daily.   Yes [provider]  Multiple Vitamin (MULTIVITAMIN WITH MINERALS) TABS tablet Take 1 tablet by mouth daily.   Yes [provider]  Norethindrone-Ethinyl Estradiol-Fe Biphas (LO LOESTRIN FE) 1 MG-10 MCG / 10 MCG tablet Take 1 tablet by mouth daily.   Yes [provider]  vortioxetine HBr (TRINTELLIX) 20 MG TABS Take 20 mg by mouth daily. 09/16/16  Yes Brunetta Jeans, PA-C  gabapentin (NEURONTIN) 300 MG capsule Take 1 capsule (300 mg total) by mouth 3 (three) times daily. 01/11/17   Caryl Ada  K, PA-C  oxyCODONE-acetaminophen (PERCOCET/ROXICET) 5-325 MG tablet Take 1-2 tablets by mouth every 4 (four) hours as needed for severe pain. 01/11/17   Fransico Meadow, PA-C  polyethylene glycol [facility]) packet Take 17 g by mouth daily. 01/11/17   Fransico Meadow, PA-C    Family History Family History  Problem Relation Age of Onset  . Pancreatic cancer Maternal Grandfather   . Parkinson's disease Mother   . Colon polyps Father   . Hypothyroidism Father   . Diabetes Paternal Grandfather   . Heart disease Paternal Grandfather   . Kidney disease Maternal Grandmother     Social History Social History  Substance Use Topics  . Smoking status: Never Smoker  . Smokeless tobacco: Never Used  .  Alcohol use 0.0 oz/week     Comment: less than 1 per day     Allergies   Morphine and Cefaclor   Review of Systems Review of Systems  Constitutional: Negative for fever.  Gastrointestinal: Positive for abdominal pain and nausea.  All other systems reviewed and are negative.    Physical Exam Updated Vital Signs BP 107/66   Pulse 73   Temp 98.4 F (36.9 C) (Oral)   Resp 18   Ht 5' 5.5" (1.664 m)   Wt 60.8 kg (134 lb)   LMP 12/25/2016 (Exact Date)   SpO2 97%   BMI 21.96 kg/m   Physical Exam  Constitutional: She appears well-developed and well-nourished. No distress.  HENT:  Head: Normocephalic and atraumatic.  Right Ear: External ear normal.  Left Ear: External ear normal.  Nose: Nose normal.  Mouth/Throat: Oropharynx is clear and moist.  Eyes: Conjunctivae are normal.  Neck: Neck supple.  Cardiovascular: Normal rate and regular rhythm.   No murmur heard. Pulmonary/Chest: Effort normal and breath sounds normal. No respiratory distress.  Abdominal: Soft. There is tenderness. There is guarding.  Musculoskeletal: Normal range of motion. She exhibits no edema.  Neurological: She is alert.  Skin: Skin is warm and dry.  Psychiatric: She has a normal mood and affect.  Nursing note and vitals reviewed.    ED Treatments / Results  Labs (all labs ordered are listed, but only abnormal results are displayed) Labs Reviewed  COMPREHENSIVE METABOLIC PANEL - Abnormal; Notable for the following:       Result Value   Glucose, Bld 102 (*)    Calcium 8.5 (*)    Alkaline Phosphatase 28 (*)    All other components within normal limits  CBC WITH DIFFERENTIAL/PLATELET  URINALYSIS, ROUTINE W REFLEX MICROSCOPIC  I-STAT CG4 LACTIC ACID, ED    EKG  EKG Interpretation None       Radiology Ct Abdomen Pelvis W Contrast  Result Date: 01/11/2017 CLINICAL DATA:  Pt had umbilical hernia surgery repair 4 days ago. Pain began last night 9 pm RUQ. Sharp throbbing then into a  stabbing pain EXAM: CT ABDOMEN AND PELVIS WITH CONTRAST TECHNIQUE: Multidetector CT imaging of the abdomen and pelvis was performed using the standard protocol following bolus administration of intravenous contrast. CONTRAST:  176mL ISOVUE-300 IOPAMIDOL (ISOVUE-300) INJECTION 61% COMPARISON:  None. FINDINGS: Lower chest: Minor dependent lower lobe subsegmental atelectasis. Lung bases otherwise clear. Heart normal in size. Hepatobiliary: Normal liver. Gallbladder is unremarkable. Mild bile duct dilation. Common bile duct measures 6 mm with normal distal tapering. Pancreas: Unremarkable. No pancreatic ductal dilatation or surrounding inflammatory changes. Spleen: Normal in size without focal abnormality. Adrenals/Urinary Tract: Adrenal glands are unremarkable. Kidneys are normal, without renal calculi, focal lesion,  or hydronephrosis. Bladder is unremarkable. Stomach/Bowel: There is moderate increased stool throughout the colon. This distends the colon particularly the right colon. Cecum measures 7.4 cm in diameter. There is no colonic wall thickening or adjacent inflammation. There is mild dilation of proximal small bowel with small bowel air-fluid levels. Small bowel gradually decreases in caliber to the distal bowel. No small bowel wall thickening or adjacent inflammation. Stomach is unremarkable. No appendix visualized. Vascular/Lymphatic: No significant vascular findings are present. No enlarged abdominal or pelvic lymph nodes. Reproductive: Uterus and bilateral adnexa are unremarkable. Other: Mild stranding noted in the anterior peritoneal fat deep to the umbilicus. There is thickening along the margins of the umbilicus. These findings are consistent with mild postoperative edema. To bubbles of air are seen within the subcutaneous soft tissues along the superior margin of the umbilicus. There is no residual hernia. No discrete abscess. No intra-abdominal abscess. Trace pelvic free fluid is physiologic in amount.  Musculoskeletal: No acute or significant osseous findings. IMPRESSION: 1. Appearance of the umbilicus is consistent with the expected postoperative change. No evidence of a residual or recurrent hernia. No abscess. 2. Moderate increased stool throughout the colon with dilation of the right colon is 7.4 cm. No colonic inflammation. No bowel obstruction. Mild dilation of the proximal small bowel with air-fluid levels suggests a mild adynamic ileus. 3. Mild dilation of the common bile duct with normal distal tapering. This is felt to be a normal variant for this patient. 4. No other abnormalities. Electronically Signed   By: Lajean Manes M.D.   On: 01/11/2017 10:49    Procedures Procedures (including critical care time)  Medications Ordered in ED Medications  iopamidol (ISOVUE-300) 61 % injection 15 mL (not administered)  HYDROmorphone (DILAUDID) injection 1 mg (not administered)  HYDROmorphone (DILAUDID) injection 1 mg (1 mg Intravenous Given 01/11/17 0702)  ondansetron (ZOFRAN) injection 4 mg (4 mg Intravenous Given 01/11/17 0701)  iopamidol (ISOVUE-300) 61 % injection (100 mLs  Contrast Given 01/11/17 1025)  ondansetron (ZOFRAN) injection 4 mg (4 mg Intravenous Given 01/11/17 0913)  HYDROmorphone (DILAUDID) injection 1 mg (1 mg Intravenous Given 01/11/17 0914)     Initial Impression / Assessment and Plan / ED Course  I have reviewed the triage vital signs and the nursing notes.  Pertinent labs & imaging results that were available during my care of the patient were reviewed by me and considered in my medical decision making (see chart for details).     Pt given dilaudid Iv.   Pt reports some relief from pain.  I spoke to Dr. Dema Severin surgeon on call.  He advised probably pain from surgical anchor and constipation.  Ct scan and labs reviewed.   Dr. Dema Severin advised, percocet, gabapentin and miralax.  Pt counseled on results.  Final Clinical Impressions(s) / ED Diagnoses   Final diagnoses:    Post-operative pain  Generalized abdominal pain    New Prescriptions New Prescriptions   GABAPENTIN (NEURONTIN) 300 MG CAPSULE    Take 1 capsule (300 mg total) by mouth 3 (three) times daily.   OXYCODONE-ACETAMINOPHEN (PERCOCET/ROXICET) 5-325 MG TABLET    Take 1-2 tablets by mouth every 4 (four) hours as needed for severe pain.   POLYETHYLENE GLYCOL (MIRALAX) PACKET    Take 17 g by mouth daily.  An After Visit Summary was printed and given to the patient.   Fransico Meadow, PA-C 62/37/62 [id number]    Delora Fuel, MD 17/61/60 [phone]

## 2017-01-11 NOTE — ED Notes (Signed)
Nurse starting IV and drawing labs. 

## 2017-01-11 NOTE — ED Notes (Signed)
Pt stable, ambulatory, and verbalizes understanding of d/c instructions.  

## 2017-01-11 NOTE — Consult Note (Addendum)
Reason for consult: Point tenderness in right abdomen following lap umb hernia repair by Dr. Derrell Lolling 01/07/17  HPI: [name] is an 41 y.o. female who is here for eval of right sided abd pain. Underwent lap umb hernia repair with mesh by Dr. Axel Filler 10/2. Discharged following uneventful surgery. Developed severe sharp point tenderness on right abd wall yesterday that has worsened. Pain made worse by pressing in exact spot with a single finger. Presented to ED for further eval. Reports less frequent BMs. Denies other complaints - no fever/chills/n/v.  Past Medical History:  Diagnosis Date  . Anxiety   . Asthma    PMH: exercise induced as a child  . Depression   . Family history of adverse reaction to anesthesia    Son PONV  . Gilberts syndrome   . Hemorrhoids   . Menstrual migraine    menstrual migraines  . PONV (postoperative nausea and vomiting)   . Umbilical hernia     Past Surgical History:  Procedure Laterality Date  . ANKLE SURGERY    . APPENDECTOMY    . CESAREAN SECTION     x 2  . GANGLION CYST EXCISION     X 2  . HERNIA REPAIR     VENTRAL  . INSERTION OF MESH N/A 01/07/2017   Procedure: INSERTION OF MESH;  Surgeon: Axel Filler, MD;  Location: [facility] OR;  Service: General;  Laterality: N/A;  . INTUSSUSCEPTION REPAIR    . MOUTH SURGERY    . SHOULDER ARTHROSCOPY     x 2 right  . TONSILLECTOMY AND ADENOIDECTOMY    . TUBAL LIGATION    . TYMPANOSTOMY TUBE PLACEMENT    . UMBILICAL HERNIA REPAIR N/A 01/07/2017   Procedure: LAPAROSCOPIC UMBILICAL HERNIA REPAIR WITH MESH;  Surgeon: Axel Filler, MD;  Location: [facility] OR;  Service: General;  Laterality: N/A;  . WISDOM TOOTH EXTRACTION      Family History  Problem Relation Age of Onset  . Pancreatic cancer Maternal Grandfather   . Parkinson's disease Mother   . Colon polyps Father   . Hypothyroidism Father   . Diabetes Paternal Grandfather   . Heart disease Paternal Grandfather   . Kidney disease  Maternal Grandmother     Social:  reports that she has never smoked. She has never used smokeless tobacco. She reports that she drinks alcohol. She reports that she does not use drugs.  Allergies:  Allergies  Allergen Reactions  . Morphine Other (See Comments)    Possible anaphylaxis/airway swelling causing delayed extubation  . Cefaclor Hives    Medications: I have reviewed the patient's current medications.  Results for orders placed or performed during the hospital encounter of 01/11/17 (from the past 48 hour(s))  CBC with Differential/Platelet     Status: None   Collection Time: 01/11/17  6:57 AM  Result Value Ref Range   WBC 6.8 4.0 - 10.5 K/uL   RBC 3.98 3.87 - 5.11 MIL/uL   Hemoglobin 12.3 12.0 - 15.0 g/dL   HCT 17.3 51.7 - 59.1 %   MCV 93.0 78.0 - 100.0 fL   MCH 30.9 26.0 - 34.0 pg   MCHC 33.2 30.0 - 36.0 g/dL   RDW 38.1 87.6 - 77.4 %   Platelets 220 150 - 400 K/uL   Neutrophils Relative % 61 %   Neutro Abs 4.1 1.7 - 7.7 K/uL   Lymphocytes Relative 27 %   Lymphs Abs 1.9 0.7 - 4.0 K/uL   Monocytes Relative 7 %  Monocytes Absolute 0.5 0.1 - 1.0 K/uL   Eosinophils Relative 5 %   Eosinophils Absolute 0.3 0.0 - 0.7 K/uL   Basophils Relative 0 %   Basophils Absolute 0.0 0.0 - 0.1 K/uL  Comprehensive metabolic panel     Status: Abnormal   Collection Time: 01/11/17  6:57 AM  Result Value Ref Range   Sodium 137 135 - 145 mmol/L   Potassium 3.6 3.5 - 5.1 mmol/L   Chloride 102 101 - 111 mmol/L   CO2 25 22 - 32 mmol/L   Glucose, Bld 102 (H) 65 - 99 mg/dL   BUN 15 6 - 20 mg/dL   Creatinine, Ser 0.80 0.44 - 1.00 mg/dL   Calcium 8.5 (L) 8.9 - 10.3 mg/dL   Total Protein 6.6 6.5 - 8.1 g/dL   Albumin 3.7 3.5 - 5.0 g/dL   AST 20 15 - 41 U/L   ALT 15 14 - 54 U/L   Alkaline Phosphatase 28 (L) 38 - 126 U/L   Total Bilirubin 1.2 0.3 - 1.2 mg/dL   GFR calc non Af Amer >60 >60 mL/min   GFR calc Af Amer >60 >60 mL/min    Comment: (NOTE) The eGFR has been calculated using the  CKD EPI equation. This calculation has not been validated in all clinical situations. eGFR's persistently <60 mL/min signify possible Chronic Kidney Disease.    Anion gap 10 5 - 15  I-Stat CG4 Lactic Acid, ED     Status: None   Collection Time: 01/11/17  7:06 AM  Result Value Ref Range   Lactic Acid, Venous 0.80 0.5 - 1.9 mmol/L  Urinalysis, Routine w reflex microscopic     Status: None   Collection Time: 01/11/17  7:29 AM  Result Value Ref Range   Color, Urine YELLOW YELLOW   APPearance CLEAR CLEAR   Specific Gravity, Urine 1.020 1.005 - 1.030   pH 6.0 5.0 - 8.0   Glucose, UA NEGATIVE NEGATIVE mg/dL   Hgb urine dipstick NEGATIVE NEGATIVE   Bilirubin Urine NEGATIVE NEGATIVE   Ketones, ur NEGATIVE NEGATIVE mg/dL   Protein, ur NEGATIVE NEGATIVE mg/dL   Nitrite NEGATIVE NEGATIVE   Leukocytes, UA NEGATIVE NEGATIVE    Ct Abdomen Pelvis W Contrast  Result Date: 01/11/2017 CLINICAL DATA:  Pt had umbilical hernia surgery repair 4 days ago. Pain began last night 9 pm RUQ. Sharp throbbing then into a stabbing pain EXAM: CT ABDOMEN AND PELVIS WITH CONTRAST TECHNIQUE: Multidetector CT imaging of the abdomen and pelvis was performed using the standard protocol following bolus administration of intravenous contrast. CONTRAST:  150m ISOVUE-300 IOPAMIDOL (ISOVUE-300) INJECTION 61% COMPARISON:  None. FINDINGS: Lower chest: Minor dependent lower lobe subsegmental atelectasis. Lung bases otherwise clear. Heart normal in size. Hepatobiliary: Normal liver. Gallbladder is unremarkable. Mild bile duct dilation. Common bile duct measures 6 mm with normal distal tapering. Pancreas: Unremarkable. No pancreatic ductal dilatation or surrounding inflammatory changes. Spleen: Normal in size without focal abnormality. Adrenals/Urinary Tract: Adrenal glands are unremarkable. Kidneys are normal, without renal calculi, focal lesion, or hydronephrosis. Bladder is unremarkable. Stomach/Bowel: There is moderate increased  stool throughout the colon. This distends the colon particularly the right colon. Cecum measures 7.4 cm in diameter. There is no colonic wall thickening or adjacent inflammation. There is mild dilation of proximal small bowel with small bowel air-fluid levels. Small bowel gradually decreases in caliber to the distal bowel. No small bowel wall thickening or adjacent inflammation. Stomach is unremarkable. No appendix visualized. Vascular/Lymphatic: No significant vascular findings  are present. No enlarged abdominal or pelvic lymph nodes. Reproductive: Uterus and bilateral adnexa are unremarkable. Other: Mild stranding noted in the anterior peritoneal fat deep to the umbilicus. There is thickening along the margins of the umbilicus. These findings are consistent with mild postoperative edema. To bubbles of air are seen within the subcutaneous soft tissues along the superior margin of the umbilicus. There is no residual hernia. No discrete abscess. No intra-abdominal abscess. Trace pelvic free fluid is physiologic in amount. Musculoskeletal: No acute or significant osseous findings. IMPRESSION: 1. Appearance of the umbilicus is consistent with the expected postoperative change. No evidence of a residual or recurrent hernia. No abscess. 2. Moderate increased stool throughout the colon with dilation of the right colon is 7.4 cm. No colonic inflammation. No bowel obstruction. Mild dilation of the proximal small bowel with air-fluid levels suggests a mild adynamic ileus. 3. Mild dilation of the common bile duct with normal distal tapering. This is felt to be a normal variant for this patient. 4. No other abnormalities. Electronically Signed   By: Lajean Manes M.D.   On: 01/11/2017 10:49    ROS - all of the below systems have been reviewed with the patient and positives are indicated with bold text General: chills, fever or night sweats Eyes: blurry vision or double vision ENT: epistaxis or sore  throat Allergy/Immunology: itchy/watery eyes or nasal congestion Hematologic/Lymphatic: bleeding problems, blood clots or swollen lymph nodes Endocrine: temperature intolerance or unexpected weight changes Breast: new or changing breast lumps or nipple discharge Resp: cough, shortness of breath, or wheezing CV: chest pain or dyspnea on exertion GI: as per HPI GU: dysuria, trouble voiding, or hematuria MSK: joint pain or joint stiffness Neuro: TIA or stroke symptoms Derm: pruritus and skin lesion changes Psych: anxiety and depression  PE Blood pressure 107/66, pulse 73, temperature 98.4 F (36.9 C), temperature source Oral, resp. rate 18, height 5' 5.5" (1.664 m), weight 60.8 kg (134 lb), last menstrual period 12/25/2016, SpO2 97 %. Gen: NAD, comfortable CV: RRR Pulm: Normal work of breathing GI: soft, exquisitely ttp in right mid abdomen; left side - incisions noted, dry without erythema, nontender. No left sided rebound/guarding. Ext: No pitting edema Results for orders placed or performed during the hospital encounter of 01/11/17 (from the past 48 hour(s))  CBC with Differential/Platelet     Status: None   Collection Time: 01/11/17  6:57 AM  Result Value Ref Range   WBC 6.8 4.0 - 10.5 K/uL   RBC 3.98 3.87 - 5.11 MIL/uL   Hemoglobin 12.3 12.0 - 15.0 g/dL   HCT 37.0 36.0 - 46.0 %   MCV 93.0 78.0 - 100.0 fL   MCH 30.9 26.0 - 34.0 pg   MCHC 33.2 30.0 - 36.0 g/dL   RDW 12.6 11.5 - 15.5 %   Platelets 220 150 - 400 K/uL   Neutrophils Relative % 61 %   Neutro Abs 4.1 1.7 - 7.7 K/uL   Lymphocytes Relative 27 %   Lymphs Abs 1.9 0.7 - 4.0 K/uL   Monocytes Relative 7 %   Monocytes Absolute 0.5 0.1 - 1.0 K/uL   Eosinophils Relative 5 %   Eosinophils Absolute 0.3 0.0 - 0.7 K/uL   Basophils Relative 0 %   Basophils Absolute 0.0 0.0 - 0.1 K/uL  Comprehensive metabolic panel     Status: Abnormal   Collection Time: 01/11/17  6:57 AM  Result Value Ref Range   Sodium 137 135 - 145  mmol/L  Potassium 3.6 3.5 - 5.1 mmol/L   Chloride 102 101 - 111 mmol/L   CO2 25 22 - 32 mmol/L   Glucose, Bld 102 (H) 65 - 99 mg/dL   BUN 15 6 - 20 mg/dL   Creatinine, Ser 0.80 0.44 - 1.00 mg/dL   Calcium 8.5 (L) 8.9 - 10.3 mg/dL   Total Protein 6.6 6.5 - 8.1 g/dL   Albumin 3.7 3.5 - 5.0 g/dL   AST 20 15 - 41 U/L   ALT 15 14 - 54 U/L   Alkaline Phosphatase 28 (L) 38 - 126 U/L   Total Bilirubin 1.2 0.3 - 1.2 mg/dL   GFR calc non Af Amer >60 >60 mL/min   GFR calc Af Amer >60 >60 mL/min    Comment: (NOTE) The eGFR has been calculated using the CKD EPI equation. This calculation has not been validated in all clinical situations. eGFR's persistently <60 mL/min signify possible Chronic Kidney Disease.    Anion gap 10 5 - 15  I-Stat CG4 Lactic Acid, ED     Status: None   Collection Time: 01/11/17  7:06 AM  Result Value Ref Range   Lactic Acid, Venous 0.80 0.5 - 1.9 mmol/L  Urinalysis, Routine w reflex microscopic     Status: None   Collection Time: 01/11/17  7:29 AM  Result Value Ref Range   Color, Urine YELLOW YELLOW   APPearance CLEAR CLEAR   Specific Gravity, Urine 1.020 1.005 - 1.030   pH 6.0 5.0 - 8.0   Glucose, UA NEGATIVE NEGATIVE mg/dL   Hgb urine dipstick NEGATIVE NEGATIVE   Bilirubin Urine NEGATIVE NEGATIVE   Ketones, ur NEGATIVE NEGATIVE mg/dL   Protein, ur NEGATIVE NEGATIVE mg/dL   Nitrite NEGATIVE NEGATIVE   Leukocytes, UA NEGATIVE NEGATIVE    Ct Abdomen Pelvis W Contrast  Result Date: 01/11/2017 CLINICAL DATA:  Pt had umbilical hernia surgery repair 4 days ago. Pain began last night 9 pm RUQ. Sharp throbbing then into a stabbing pain EXAM: CT ABDOMEN AND PELVIS WITH CONTRAST TECHNIQUE: Multidetector CT imaging of the abdomen and pelvis was performed using the standard protocol following bolus administration of intravenous contrast. CONTRAST:  121m ISOVUE-300 IOPAMIDOL (ISOVUE-300) INJECTION 61% COMPARISON:  None. FINDINGS: Lower chest: Minor dependent lower lobe  subsegmental atelectasis. Lung bases otherwise clear. Heart normal in size. Hepatobiliary: Normal liver. Gallbladder is unremarkable. Mild bile duct dilation. Common bile duct measures 6 mm with normal distal tapering. Pancreas: Unremarkable. No pancreatic ductal dilatation or surrounding inflammatory changes. Spleen: Normal in size without focal abnormality. Adrenals/Urinary Tract: Adrenal glands are unremarkable. Kidneys are normal, without renal calculi, focal lesion, or hydronephrosis. Bladder is unremarkable. Stomach/Bowel: There is moderate increased stool throughout the colon. This distends the colon particularly the right colon. Cecum measures 7.4 cm in diameter. There is no colonic wall thickening or adjacent inflammation. There is mild dilation of proximal small bowel with small bowel air-fluid levels. Small bowel gradually decreases in caliber to the distal bowel. No small bowel wall thickening or adjacent inflammation. Stomach is unremarkable. No appendix visualized. Vascular/Lymphatic: No significant vascular findings are present. No enlarged abdominal or pelvic lymph nodes. Reproductive: Uterus and bilateral adnexa are unremarkable. Other: Mild stranding noted in the anterior peritoneal fat deep to the umbilicus. There is thickening along the margins of the umbilicus. These findings are consistent with mild postoperative edema. To bubbles of air are seen within the subcutaneous soft tissues along the superior margin of the umbilicus. There is no residual hernia. No discrete  abscess. No intra-abdominal abscess. Trace pelvic free fluid is physiologic in amount. Musculoskeletal: No acute or significant osseous findings. IMPRESSION: 1. Appearance of the umbilicus is consistent with the expected postoperative change. No evidence of a residual or recurrent hernia. No abscess. 2. Moderate increased stool throughout the colon with dilation of the right colon is 7.4 cm. No colonic inflammation. No bowel  obstruction. Mild dilation of the proximal small bowel with air-fluid levels suggests a mild adynamic ileus. 3. Mild dilation of the common bile duct with normal distal tapering. This is felt to be a normal variant for this patient. 4. No other abnormalities. Electronically Signed   By: Lajean Manes M.D.   On: 01/11/2017 10:49    A/P: POD#4 from lap umb hernia repair with mesh  -No fevers, WBC normal, Hgb stable; CT shows constipation, no recurrence or other concerning process -Her pain is likely 2/2 a tac holding mesh in place -Stable for discharge with new Rx for daily miralax; oxycodone - 10/'325mg'$  to be taken q6hrs prn; gabapentin '300mg'$  PO TID; continue ibuprofen '800mg'$  q6hrs prn -Discussed all of the above and she's comfortable with the plan; information relayed to ED team as well -She has f/u scheduled with Dr. Rosendo Gros at [place] later this month  Sharon Mt. Dema Severin, M.D. [facility]

## 2017-01-11 NOTE — Discharge Instructions (Signed)
Follow up with your Surgeon as scheduled.  Medication as directed

## 2017-01-11 NOTE — ED Notes (Signed)
Patient transported to CT 

## 2017-02-11 ENCOUNTER — Ambulatory Visit (INDEPENDENT_AMBULATORY_CARE_PROVIDER_SITE_OTHER): Payer: 59 | Admitting: Physician Assistant

## 2017-02-11 ENCOUNTER — Encounter: Payer: Self-pay | Admitting: Physician Assistant

## 2017-02-11 DIAGNOSIS — R4589 Other symptoms and signs involving emotional state: Secondary | ICD-10-CM | POA: Insufficient documentation

## 2017-02-11 DIAGNOSIS — F329 Major depressive disorder, single episode, unspecified: Secondary | ICD-10-CM | POA: Diagnosis not present

## 2017-02-11 HISTORY — DX: Other symptoms and signs involving emotional state: R45.89

## 2017-02-11 MED ORDER — BUPROPION HCL 75 MG PO TABS: 75.0000 mg | ORAL_TABLET | Freq: Two times a day (BID) | ORAL | 0 refills | Status: DC

## 2017-02-11 MED FILL — buPROPion HCL 75 MG TABS: 75 | 30 days supply | Qty: 60 | Fill #0

## 2017-02-11 NOTE — Patient Instructions (Addendum)
Please start the Wellbutrin taking 1/2 tablet daily. Can increase to one tablet daily once tolerating. Let me know how you are doing on this medication and we can make further adjustments.

## 2017-02-11 NOTE — Assessment & Plan Note (Signed)
Previously very well controlled with current Trintellix 20 mg daily.  No side effects.  Some inattentiveness secondary to multiple stressors.  Notes increased irritability and feelings of being overwhelmed.  Will attempt a short-term trial of low-dose Wellbutrin to see if this helps.  Discussed potential for Wellbutrin to increase anxiety.  If she notes this, she is to stop medication and notify us immediately.

## 2017-02-11 NOTE — Progress Notes (Signed)
Patient presents to clinic today to discuss issues with feeling scatterbrained over the past couple of months. States this has been going on for a few months but has become more of a problem to her as she recently missed an appointment.  Patient with history of anxiety and depression, for which she currently takes 2 tablets 20 mg daily.  States this is done a great job with controlling depressed mood and most of her anxiety.  Denies side effects with this medication.  Patient endorses significant increase in stressors.  Feeling overwhelmed at times.  Notes attentiveness is suffering.  Would like to discuss the potential addition of short acting Wellbutrin to help with mood and focus.   Past Medical History:  Diagnosis Date  . Anxiety   . Asthma    PMH: exercise induced as a child  . Depression   . Family history of adverse reaction to anesthesia    Son PONV  . Gilberts syndrome   . Hemorrhoids   . Menstrual migraine    menstrual migraines  . PONV (postoperative nausea and vomiting)   . Umbilical hernia     Current Outpatient Medications on File Prior to Visit  Medication Sig Dispense Refill  . aspirin-acetaminophen-caffeine (EXCEDRIN MIGRAINE) 250-250-65 MG tablet Take 2 tablets by mouth daily as needed for headache or migraine.    . cetirizine (ZYRTEC) 10 MG tablet Take 10 mg by mouth daily.    . Multiple Vitamin (MULTIVITAMIN WITH MINERALS) TABS tablet Take 1 tablet by mouth daily.    . Norethindrone-Ethinyl Estradiol-Fe Biphas (LO LOESTRIN FE) 1 MG-10 MCG / 10 MCG tablet Take 1 tablet by mouth daily.    Marland Kitchen vortioxetine HBr (TRINTELLIX) 20 MG TABS Take 20 mg by mouth daily. 90 tablet 1   No current facility-administered medications on file prior to visit.     Allergies  Allergen Reactions  . Morphine Other (See Comments)    Possible anaphylaxis/airway swelling causing delayed extubation  . Cefaclor Hives    Family History  Problem Relation Age of Onset  . Pancreatic cancer  Maternal Grandfather   . Parkinson's disease Mother   . Colon polyps Father   . Hypothyroidism Father   . Diabetes Paternal Grandfather   . Heart disease Paternal Grandfather   . Kidney disease Maternal Grandmother     Social History   Socioeconomic History  . Marital status: Married    Spouse name: None  . Number of children: 2  . Years of education: None  . Highest education level: None  Social Needs  . Financial resource strain: None  . Food insecurity - worry: None  . Food insecurity - inability: None  . Transportation needs - medical: None  . Transportation needs - non-medical: None  Occupational History  . Occupation: physician  Tobacco Use  . Smoking status: Never Smoker  . Smokeless tobacco: Never Used  Substance and Sexual Activity  . Alcohol use: Yes    Alcohol/week: 0.0 oz    Comment: less than 1 per day  . Drug use: No  . Sexual activity: None  Other Topics Concern  . None  Social History Narrative  . None   Review of Systems - See HPI.  All other ROS are negative.  BP 102/62   Pulse 70   Temp 97.9 F (36.6 C) (Oral)   Resp 14   Ht '5\' 6"'$  (1.676 m)   Wt 127 lb (57.6 kg)   SpO2 99%   BMI 20.50 kg/m  Physical Exam  Constitutional: She is well-developed, well-nourished, and in no distress.  HENT:  Head: Normocephalic and atraumatic.  Eyes: Conjunctivae are normal.  Neurological: She is alert.  Skin: Skin is warm and dry. No rash noted.  Psychiatric: Affect normal.    Recent Results (from the past 2160 hour(s))  HM PAP SMEAR     Status: None   Collection Time: 12/17/16 12:00 AM  Result Value Ref Range   HM Pap smear Normal   CBC     Status: None   Collection Time: 12/31/16  3:21 PM  Result Value Ref Range   WBC 7.8 4.0 - 10.5 K/uL   RBC 4.24 3.87 - 5.11 MIL/uL   Hemoglobin 13.0 12.0 - 15.0 g/dL   HCT 39.7 36.0 - 46.0 %   MCV 93.6 78.0 - 100.0 fL   MCH 30.7 26.0 - 34.0 pg   MCHC 32.7 30.0 - 36.0 g/dL   RDW 12.7 11.5 - 15.5 %    Platelets 246 150 - 400 K/uL  hCG, serum, qualitative     Status: None   Collection Time: 12/31/16  3:21 PM  Result Value Ref Range   Preg, Serum NEGATIVE NEGATIVE    Comment:        THE SENSITIVITY OF THIS METHODOLOGY IS >10 mIU/mL.   Comprehensive metabolic panel     Status: Abnormal   Collection Time: 12/31/16  3:21 PM  Result Value Ref Range   Sodium 136 135 - 145 mmol/L   Potassium 3.9 3.5 - 5.1 mmol/L   Chloride 106 101 - 111 mmol/L   CO2 24 22 - 32 mmol/L   Glucose, Bld 95 65 - 99 mg/dL   BUN 19 6 - 20 mg/dL   Creatinine, Ser 0.77 0.44 - 1.00 mg/dL   Calcium 9.2 8.9 - 10.3 mg/dL   Total Protein 7.1 6.5 - 8.1 g/dL   Albumin 4.5 3.5 - 5.0 g/dL   AST 20 15 - 41 U/L   ALT 12 (L) 14 - 54 U/L   Alkaline Phosphatase 31 (L) 38 - 126 U/L   Total Bilirubin 1.2 0.3 - 1.2 mg/dL   GFR calc non Af Amer >60 >60 mL/min   GFR calc Af Amer >60 >60 mL/min    Comment: (NOTE) The eGFR has been calculated using the CKD EPI equation. This calculation has not been validated in all clinical situations. eGFR's persistently <60 mL/min signify possible Chronic Kidney Disease.    Anion gap 6 5 - 15  CBC with Differential/Platelet     Status: None   Collection Time: 01/11/17  6:57 AM  Result Value Ref Range   WBC 6.8 4.0 - 10.5 K/uL   RBC 3.98 3.87 - 5.11 MIL/uL   Hemoglobin 12.3 12.0 - 15.0 g/dL   HCT 37.0 36.0 - 46.0 %   MCV 93.0 78.0 - 100.0 fL   MCH 30.9 26.0 - 34.0 pg   MCHC 33.2 30.0 - 36.0 g/dL   RDW 12.6 11.5 - 15.5 %   Platelets 220 150 - 400 K/uL   Neutrophils Relative % 61 %   Neutro Abs 4.1 1.7 - 7.7 K/uL   Lymphocytes Relative 27 %   Lymphs Abs 1.9 0.7 - 4.0 K/uL   Monocytes Relative 7 %   Monocytes Absolute 0.5 0.1 - 1.0 K/uL   Eosinophils Relative 5 %   Eosinophils Absolute 0.3 0.0 - 0.7 K/uL   Basophils Relative 0 %   Basophils Absolute 0.0 0.0 - 0.1 K/uL  Comprehensive metabolic panel     Status: Abnormal   Collection Time: 01/11/17  6:57 AM  Result Value Ref  Range   Sodium 137 135 - 145 mmol/L   Potassium 3.6 3.5 - 5.1 mmol/L   Chloride 102 101 - 111 mmol/L   CO2 25 22 - 32 mmol/L   Glucose, Bld 102 (H) 65 - 99 mg/dL   BUN 15 6 - 20 mg/dL   Creatinine, Ser 0.80 0.44 - 1.00 mg/dL   Calcium 8.5 (L) 8.9 - 10.3 mg/dL   Total Protein 6.6 6.5 - 8.1 g/dL   Albumin 3.7 3.5 - 5.0 g/dL   AST 20 15 - 41 U/L   ALT 15 14 - 54 U/L   Alkaline Phosphatase 28 (L) 38 - 126 U/L   Total Bilirubin 1.2 0.3 - 1.2 mg/dL   GFR calc non Af Amer >60 >60 mL/min   GFR calc Af Amer >60 >60 mL/min    Comment: (NOTE) The eGFR has been calculated using the CKD EPI equation. This calculation has not been validated in all clinical situations. eGFR's persistently <60 mL/min signify possible Chronic Kidney Disease.    Anion gap 10 5 - 15  I-Stat CG4 Lactic Acid, ED     Status: None   Collection Time: 01/11/17  7:06 AM  Result Value Ref Range   Lactic Acid, Venous 0.80 0.5 - 1.9 mmol/L  Urinalysis, Routine w reflex microscopic     Status: None   Collection Time: 01/11/17  7:29 AM  Result Value Ref Range   Color, Urine YELLOW YELLOW   APPearance CLEAR CLEAR   Specific Gravity, Urine 1.020 1.005 - 1.030   pH 6.0 5.0 - 8.0   Glucose, UA NEGATIVE NEGATIVE mg/dL   Hgb urine dipstick NEGATIVE NEGATIVE   Bilirubin Urine NEGATIVE NEGATIVE   Ketones, ur NEGATIVE NEGATIVE mg/dL   Protein, ur NEGATIVE NEGATIVE mg/dL   Nitrite NEGATIVE NEGATIVE   Leukocytes, UA NEGATIVE NEGATIVE    Assessment/Plan: Depressed mood Previously very well controlled with current Trintellix 20 mg daily.  No side effects.  Some inattentiveness secondary to multiple stressors.  Notes increased irritability and feelings of being overwhelmed.  Will attempt a short-term trial of low-dose Wellbutrin to see if this helps.  Discussed potential for Wellbutrin to increase anxiety.  If she notes this, she is to stop medication and notify us immediately.    Leeanne Rio, PA-C

## 2017-02-11 NOTE — Progress Notes (Signed)
Pre visit review using our clinic review tool, if applicable. No additional management support is needed unless otherwise documented below in the visit note. 

## 2017-02-14 ENCOUNTER — Other Ambulatory Visit: Payer: Self-pay | Admitting: General Practice

## 2017-02-14 ENCOUNTER — Telehealth: Payer: Self-pay | Admitting: *Deleted

## 2017-02-14 MED ORDER — NORETHIN-ETH ESTRAD-FE BIPHAS 1 MG-10 MCG / 10 MCG PO TABS: 1.0000 | ORAL_TABLET | Freq: Every day | ORAL | 3 refills | Status: DC

## 2017-02-14 MED FILL — LO LOESTRIN FE 1-10 TABLET: 1 MG-10 MCG | 84 days supply | Qty: 84 | Fill #0

## 2017-02-14 NOTE — Telephone Encounter (Signed)
PA started through cover my meds for Lo Loestrin.  Awaiting insurance approval.  Will contact pharmacy when approved.  KEY:  [id number]

## 2017-02-19 NOTE — Telephone Encounter (Signed)
Spoke with Insurance - medication has been approved through 02/13/17.   Per pharmacy, medication has been submitted to insurance and picked up.

## 2017-03-19 ENCOUNTER — Other Ambulatory Visit: Payer: Self-pay | Admitting: Physician Assistant

## 2017-03-19 MED ORDER — VORTIOXETINE HBR 20 MG PO TABS: 20.0000 mg | ORAL_TABLET | Freq: Every day | ORAL | 3 refills | Status: DC

## 2017-03-19 MED FILL — TRINTELLIX 20 MG TABLET: 20 | 90 days supply | Qty: 90 | Fill #0

## 2017-04-22 ENCOUNTER — Other Ambulatory Visit: Payer: Self-pay | Admitting: Physician Assistant

## 2017-04-22 MED ORDER — BUPROPION HCL 75 MG PO TABS: 75.0000 mg | ORAL_TABLET | Freq: Two times a day (BID) | ORAL | 3 refills | Status: DC

## 2017-04-22 MED FILL — buPROPion HCL 75 MG TABS: 75 | 30 days supply | Qty: 60 | Fill #0

## 2017-05-12 ENCOUNTER — Other Ambulatory Visit: Payer: Self-pay | Admitting: Physician Assistant

## 2017-05-12 MED ORDER — NORETHIN-ETH ESTRAD-FE BIPHAS 1 MG-10 MCG / 10 MCG PO TABS: 1.0000 | ORAL_TABLET | Freq: Every day | ORAL | 3 refills | Status: DC

## 2017-06-18 ENCOUNTER — Other Ambulatory Visit: Payer: Self-pay | Admitting: Physician Assistant

## 2017-06-18 MED ORDER — SUCRALFATE 1 G PO TABS: 1.0000 g | ORAL_TABLET | Freq: Three times a day (TID) | ORAL | 0 refills | Status: DC

## 2017-06-18 MED ORDER — PANTOPRAZOLE SODIUM 40 MG PO TBEC: 40.0000 mg | DELAYED_RELEASE_TABLET | Freq: Every day | ORAL | 3 refills | Status: DC

## 2017-06-18 NOTE — Progress Notes (Signed)
Patient with signs/symptoms of a stress ulcer. No alarm symptoms. Is taking Ranitidine with no major relief in symptoms. Will send in Pantoprazole and Carafate.   Follow-up scheduled.

## 2017-07-01 MED FILL — buPROPion HCL 75 MG TABS: 75 | 30 days supply | Qty: 60 | Fill #1

## 2017-07-01 MED FILL — PANTOPRAZOLE SOD DR 40 MG T: 40 | 30 days supply | Qty: 30 | Fill #0

## 2017-07-08 ENCOUNTER — Other Ambulatory Visit: Payer: Self-pay | Admitting: Occupational Medicine

## 2017-07-08 LAB — HEPATIC FUNCTION PANEL
ALT: 10 (ref 7–35)
AST: 16 (ref 13–35)
Alkaline Phosphatase: 28 (ref 25–125)
Bilirubin, Total: 0.9

## 2017-07-08 LAB — CBC AND DIFFERENTIAL
HEMATOCRIT: 38 (ref 36–46)
Hemoglobin: 13.1 (ref 12.0–16.0)
Neutrophils Absolute: 4657
Platelets: 271 (ref 150–399)
WBC: 7.8

## 2017-07-08 LAB — BASIC METABOLIC PANEL
BUN: 17 (ref 4–21)
Creatinine: 0.8 (ref 0.5–1.1)
GLUCOSE: 83
Potassium: 4.3 (ref 3.4–5.3)
SODIUM: 139 (ref 137–147)

## 2017-07-08 LAB — LIPID PANEL
Cholesterol: 108 (ref 0–200)
HDL: 66 (ref 35–70)
LDL Cholesterol: 30
Triglycerides: 41 (ref 40–160)

## 2017-07-08 LAB — TSH: TSH: 0.56 (ref 0.41–5.90)

## 2017-07-09 LAB — CBC WITH DIFFERENTIAL/PLATELET
Basophils Absolute: 47 cells/uL (ref 0–200)
Basophils Relative: 0.6 %
Eosinophils Absolute: 203 cells/uL (ref 15–500)
Eosinophils Relative: 2.6 %
HEMATOCRIT: 37.5 % — AB (ref 38.5–45.0)
Hemoglobin: 13.1 g/dL — ABNORMAL LOW (ref 13.2–15.5)
LYMPHS ABS: 2379 {cells}/uL (ref 850–3900)
MCH: 31.6 pg (ref 27.0–33.0)
MCHC: 34.9 g/dL (ref 32.0–36.0)
MCV: 90.6 fL (ref 80.0–100.0)
MPV: 11 fL (ref 7.5–12.5)
Monocytes Relative: 6.6 %
Neutro Abs: 4657 cells/uL (ref 1500–7800)
Neutrophils Relative %: 59.7 %
Platelets: 271 10*3/uL (ref 140–400)
RBC: 4.14 10*6/uL — AB (ref 4.20–5.10)
RDW: 12 % (ref 11.0–15.0)
Total Lymphocyte: 30.5 %
WBC: 7.8 10*3/uL (ref 3.8–10.8)
WBCMIX: 515 {cells}/uL (ref 200–950)

## 2017-07-09 LAB — LIPID PANEL
Cholesterol: 108 mg/dL (ref <200)
HDL: 66 mg/dL
LDL CHOLESTEROL (CALC): 30 mg/dL
Non-HDL Cholesterol (Calc): 42 mg/dL (calc) (ref <130)
TRIGLYCERIDES: 41 mg/dL (ref <150)
Total CHOL/HDL Ratio: 1.6 (calc) (ref <5.0)

## 2017-07-09 LAB — COMPLETE METABOLIC PANEL WITH GFR
AG Ratio: 2.2 (calc) (ref 1.0–2.5)
ALT: 10 U/L
AST: 16 U/L (ref 10–40)
Albumin: 4.8 g/dL (ref 3.6–5.1)
Alkaline phosphatase (APISO): 28 U/L — ABNORMAL LOW (ref 33–115)
BUN: 17 mg/dL (ref 7–25)
CO2: 26 mmol/L (ref 20–32)
CREATININE: 0.75 mg/dL
Calcium: 9.9 mg/dL
Chloride: 103 mmol/L (ref 98–110)
GLUCOSE: 83 mg/dL (ref 65–99)
Globulin: 2.2 g/dL (calc) (ref 1.9–3.7)
Potassium: 4.3 mmol/L (ref 3.5–5.3)
Sodium: 139 mmol/L (ref 135–146)
Total Bilirubin: 0.9 mg/dL (ref 0.2–1.2)
Total Protein: 7 g/dL (ref 6.1–8.1)

## 2017-07-09 LAB — TSH: TSH: 0.56 mIU/L (ref 0.40–4.50)

## 2017-07-15 MED FILL — TRINTELLIX 20 MG TABLET: 20 | 90 days supply | Qty: 90 | Fill #1

## 2017-07-21 ENCOUNTER — Encounter: Payer: Self-pay | Admitting: General Practice

## 2017-08-04 ENCOUNTER — Other Ambulatory Visit: Payer: Self-pay | Admitting: General Practice

## 2017-08-04 MED ORDER — PANTOPRAZOLE SODIUM 40 MG PO TBEC: 40.0000 mg | DELAYED_RELEASE_TABLET | Freq: Every day | ORAL | 3 refills | Status: DC

## 2017-08-04 MED FILL — PANTOPRAZOLE SOD DR 40 MG T: 40 | 30 days supply | Qty: 30 | Fill #0

## 2017-08-12 DIAGNOSIS — H5203 Hypermetropia, bilateral: Secondary | ICD-10-CM | POA: Diagnosis not present

## 2017-08-26 ENCOUNTER — Other Ambulatory Visit: Payer: Self-pay | Admitting: Physician Assistant

## 2017-08-26 ENCOUNTER — Other Ambulatory Visit: Payer: Self-pay | Admitting: General Practice

## 2017-08-26 MED ORDER — PANTOPRAZOLE SODIUM 40 MG PO TBEC: 40.0000 mg | DELAYED_RELEASE_TABLET | Freq: Every day | ORAL | 3 refills | Status: DC

## 2017-08-26 MED ORDER — BUPROPION HCL 75 MG PO TABS: 75.0000 mg | ORAL_TABLET | Freq: Two times a day (BID) | ORAL | 1 refills | Status: DC

## 2017-08-26 MED FILL — buPROPion HCL 75 MG TABS: 75 | 90 days supply | Qty: 180 | Fill #0

## 2017-09-04 ENCOUNTER — Encounter: Payer: Self-pay | Admitting: Physician Assistant

## 2017-09-04 ENCOUNTER — Ambulatory Visit (INDEPENDENT_AMBULATORY_CARE_PROVIDER_SITE_OTHER): Payer: 59 | Admitting: Physician Assistant

## 2017-09-04 ENCOUNTER — Other Ambulatory Visit: Payer: Self-pay

## 2017-09-04 VITALS — BP 101/78 | HR 65 | Temp 97.9°F | Resp 16 | Ht 66.0 in | Wt 130.2 lb

## 2017-09-04 DIAGNOSIS — Z8349 Family history of other endocrine, nutritional and metabolic diseases: Secondary | ICD-10-CM

## 2017-09-04 DIAGNOSIS — F329 Major depressive disorder, single episode, unspecified: Secondary | ICD-10-CM | POA: Diagnosis not present

## 2017-09-04 DIAGNOSIS — R4589 Other symptoms and signs involving emotional state: Secondary | ICD-10-CM

## 2017-09-04 DIAGNOSIS — Z0000 Encounter for general adult medical examination without abnormal findings: Secondary | ICD-10-CM

## 2017-09-04 NOTE — Progress Notes (Signed)
Patient presents to clinic today for annual exam.  Patient is fasting for labs.  Diet -- Well-balanced diet overall. Tries to stay hydrated.  Exercise -- Stays very active with kids. 3-4 x week for 45 minutes - cardio.    Body mass index is 21.02 kg/m.  Acute Concerns: Denies acute concerns today.  Chronic Issues: Anxiety/Depression -- Currently on a regimen of Trintellix 20 mg daily and Wellbutrin 75 mg daily. Is tolerating well. Has been helping with her mood but recently has been dealing with increasing stressors both at work and at home. Feels that she has a good support system. Denies any SI/HI.  Weight/appetite stable.  Health Maintenance: Immunizations -- Tetanus up-to-date.  PAP -- 12/17/2016. Normal. Followed by Dr. Helane Rima.  -- Last year at [facility].   Past Medical History:  Diagnosis Date  . Anxiety   . Asthma    PMH: exercise induced as a child  . Depression   . Family history of adverse reaction to anesthesia    Son PONV  . Gilberts syndrome   . Hemorrhoids   . Menstrual migraine    menstrual migraines  . PONV (postoperative nausea and vomiting)   . Umbilical hernia     Past Surgical History:  Procedure Laterality Date  . ANKLE SURGERY    . APPENDECTOMY    . CESAREAN SECTION     x 2  . GANGLION CYST EXCISION     X 2  . HERNIA REPAIR     VENTRAL  . INSERTION OF MESH N/A 01/07/2017   Procedure: INSERTION OF MESH;  Surgeon: Ralene Ok, MD;  Location: [place];  Service: General;  Laterality: N/A;  . INTUSSUSCEPTION REPAIR    . MOUTH SURGERY    . SHOULDER ARTHROSCOPY     x 2 right  . TONSILLECTOMY AND ADENOIDECTOMY    . TUBAL LIGATION    . TYMPANOSTOMY TUBE PLACEMENT    . UMBILICAL HERNIA REPAIR N/A 01/07/2017   Procedure: LAPAROSCOPIC UMBILICAL HERNIA REPAIR WITH MESH;  Surgeon: Ralene Ok, MD;  Location: [place];  Service: General;  Laterality: N/A;  . WISDOM TOOTH EXTRACTION      Current Outpatient Medications on File Prior to Visit    Medication Sig Dispense Refill  . aspirin-acetaminophen-caffeine (EXCEDRIN MIGRAINE) 250-250-65 MG tablet Take 2 tablets by mouth daily as needed for headache or migraine.    Marland Kitchen buPROPion (WELLBUTRIN) 75 MG tablet Take 1 tablet (75 mg total) by mouth 2 (two) times daily. 180 tablet 1  . cetirizine (ZYRTEC) 10 MG tablet Take 10 mg by mouth daily.    . Multiple Vitamin (MULTIVITAMIN WITH MINERALS) TABS tablet Take 1 tablet by mouth daily.    . pantoprazole (PROTONIX) 40 MG tablet Take 1 tablet (40 mg total) by mouth daily. 90 tablet 3  . vortioxetine HBr (TRINTELLIX) 20 MG TABS Take 20 mg by mouth daily. 90 tablet 3   No current facility-administered medications on file prior to visit.     Allergies  Allergen Reactions  . Morphine Other (See Comments)    Possible anaphylaxis/airway swelling causing delayed extubation  . Cefaclor Hives    Family History  Problem Relation Age of Onset  . Pancreatic cancer Maternal Grandfather   . Parkinson's disease Mother   . Depression Mother   . Colon polyps Father   . Hypothyroidism Father   . Alzheimer's disease Paternal Grandfather   . Polycystic kidney disease Maternal Grandmother   . Other Brother  essential tremor  . Polycystic kidney disease Maternal Uncle   . Heart disease Paternal Grandmother   . Diabetes Paternal Grandmother   . Cancer Maternal Uncle        [facility]  . Cancer Cousin        breast  . Cancer Cousin        breast    Social History   Socioeconomic History  . Marital status: Married    Spouse name: Not on file  . Number of children: 2  . Years of education: Not on file  . Highest education level: Not on file  Occupational History  . Occupation: physician  Social Needs  . Financial resource strain: Not on file  . Food insecurity:    Worry: Not on file    Inability: Not on file  . Transportation needs:    Medical: Not on file    Non-medical: Not on file  Tobacco Use  . Smoking status: Never Smoker  .  Smokeless tobacco: Never Used  Substance and Sexual Activity  . Alcohol use: Yes    Alcohol/week: 0.0 oz    Comment: less than 1 per day  . Drug use: No  . Sexual activity: Yes  Lifestyle  . Physical activity:    Days per week: Not on file    Minutes per session: Not on file  . Stress: Not on file  Relationships  . Social connections:    Talks on phone: Not on file    Gets together: Not on file    Attends religious service: Not on file    Active member of club or organization: Not on file    Attends meetings of clubs or organizations: Not on file    Relationship status: Not on file  . Intimate partner violence:    Fear of current or ex partner: Not on file    Emotionally abused: Not on file    Physically abused: Not on file    Forced sexual activity: Not on file  Other Topics Concern  . Not on file  Social History Narrative  . Not on file   Review of Systems  Constitutional: Negative for fever and weight loss.  HENT: Negative for ear discharge, ear pain, hearing loss and tinnitus.   Eyes: Negative for blurred vision, double vision, photophobia and pain.  Respiratory: Negative for cough and shortness of breath.   Cardiovascular: Negative for chest pain and palpitations.  Gastrointestinal: Negative for abdominal pain, blood in stool, constipation, diarrhea, heartburn, melena, nausea and vomiting.  Genitourinary: Negative for dysuria, flank pain, frequency, hematuria and urgency.  Musculoskeletal: Negative for falls.  Neurological: Negative for dizziness, loss of consciousness and headaches.  Endo/Heme/Allergies: Negative for environmental allergies.  Psychiatric/Behavioral: Negative for depression, hallucinations, substance abuse and suicidal ideas. The patient is not nervous/anxious and does not have insomnia.    BP 101/78   Pulse 65   Temp 97.9 F (36.6 C) (Oral)   Resp 16   Ht '5\' 6"'$  (1.676 m)   Wt 130 lb 4 oz (59.1 kg)   SpO2 99%   BMI 21.02 kg/m   Physical Exam   Constitutional: She is oriented to person, place, and time.  HENT:  Head: Normocephalic and atraumatic.  Right Ear: Tympanic membrane, external ear and ear canal normal.  Left Ear: Tympanic membrane, external ear and ear canal normal.  Nose: Nose normal. No mucosal edema.  Mouth/Throat: Uvula is midline, oropharynx is clear and moist and mucous membranes are normal. No  oropharyngeal exudate or posterior oropharyngeal erythema.  Eyes: Pupils are equal, round, and reactive to light. Conjunctivae are normal.  Neck: Neck supple. No thyromegaly present.  Cardiovascular: Normal rate, regular rhythm, normal heart sounds and intact distal pulses.  Pulmonary/Chest: Effort normal and breath sounds normal. No respiratory distress. She has no wheezes. She has no rales.  Abdominal: Soft. Bowel sounds are normal. She exhibits no distension and no mass. There is no tenderness. There is no rebound and no guarding.  Lymphadenopathy:    She has no cervical adenopathy.  Neurological: She is alert and oriented to person, place, and time. No cranial nerve deficit.  Skin: Skin is warm and dry. No rash noted.  Vitals reviewed.   Recent Results (from the past 2160 hour(s))  CBC and differential     Status: None   Collection Time: 07/08/17 12:00 AM  Result Value Ref Range   Hemoglobin 13.1 12.0 - 16.0   HCT 38 36 - 46   Neutrophils Absolute 4,657    Platelets 271 150 - 399   WBC 7.8   Basic metabolic panel     Status: None   Collection Time: 07/08/17 12:00 AM  Result Value Ref Range   Glucose 83    BUN 17 4 - 21   Creatinine 0.8 0.5 - 1.1   Potassium 4.3 3.4 - 5.3   Sodium 139 137 - 147  Lipid panel     Status: None   Collection Time: 07/08/17 12:00 AM  Result Value Ref Range   Triglycerides 41 40 - 160   Cholesterol 108 0 - 200   HDL 66 35 - 70   LDL Cholesterol 30   Hepatic function panel     Status: None   Collection Time: 07/08/17 12:00 AM  Result Value Ref Range   Alkaline Phosphatase 28  25 - 125   ALT 10 7 - 35   AST 16 13 - 35   Bilirubin, Total 0.9   TSH     Status: None   Collection Time: 07/08/17 12:00 AM  Result Value Ref Range   TSH 0.56 0.41 - 5.90  Lipid panel     Status: None   Collection Time: 07/08/17 12:00 PM  Result Value Ref Range   Cholesterol 108 <200 mg/dL   HDL 66 mg/dL    Comment:                        Reference Range                        Female:    >40                        Female:  >50    Triglycerides 41 <150 mg/dL   LDL Cholesterol (Calc) 30 mg/dL (calc)    Comment: Reference range: <100 . Desirable range <100 mg/dL for primary prevention;   <70 mg/dL for patients with CHD or diabetic patients  with > or = 2 CHD risk factors. Marland Kitchen LDL-C is now calculated using the Rashan Rounsaville-Hopkins  calculation, which is a validated novel method providing  better accuracy than the Friedewald equation in the  estimation of LDL-C.  Cresenciano Genre et al. Annamaria Helling. 4010;272(53): 2061-2068  (http://education.[facility].com/faq/FAQ164)    Total CHOL/HDL Ratio 1.6 <5.0 (calc)   Non-HDL Cholesterol (Calc) 42 <130 mg/dL (calc)    Comment: For patients with diabetes plus 1 major  ASCVD risk  factor, treating to a non-HDL-C goal of <100 mg/dL  (LDL-C of <70 mg/dL) is considered a therapeutic  option.   COMPLETE METABOLIC PANEL WITH GFR     Status: Abnormal   Collection Time: 07/08/17 12:00 PM  Result Value Ref Range   Glucose, Bld 83 65 - 99 mg/dL    Comment: .            Fasting reference interval .    BUN 17 7 - 25 mg/dL   Creat 0.75 mg/dL    Comment: . Age and/or gender not provided. Unable to calculate eGFR. Marland Kitchen           Reference Range           Female:   0.60-1.35           Female: 0.50-1.10           Unable to flag appropriately           due to gender not provided. .    BUN/Creatinine Ratio NOT APPLICABLE 6 - 22 (calc)   Sodium 139 135 - 146 mmol/L   Potassium 4.3 3.5 - 5.3 mmol/L   Chloride 103 98 - 110 mmol/L   CO2 26 20 - 32 mmol/L    Calcium 9.9 mg/dL    Comment:           Reference Range           Female:   8.6-10.3                Female: 8.6-10.2           Unable to flag appropriately           due to gender not provided.  .    Total Protein 7.0 6.1 - 8.1 g/dL   Albumin 4.8 3.6 - 5.1 g/dL   Globulin 2.2 1.9 - 3.7 g/dL (calc)   AG Ratio 2.2 1.0 - 2.5 (calc)   Total Bilirubin 0.9 0.2 - 1.2 mg/dL   Alkaline phosphatase (APISO) 28 (L) 33 - 115 U/L   AST 16 10 - 40 U/L   ALT 10 U/L    Comment:           Reference range           Female:   45-46           Female: 6-29           Unable to flag appropriately           due to gender not provided.   TSH     Status: None   Collection Time: 07/08/17 12:00 PM  Result Value Ref Range   TSH 0.56 0.40 - 4.50 mIU/L  CBC with Differential/Platelet     Status: Abnormal   Collection Time: 07/08/17 12:00 PM  Result Value Ref Range   WBC 7.8 3.8 - 10.8 Thousand/uL   RBC 4.14 (L) 4.20 - 5.10 Million/uL   Hemoglobin 13.1 (L) 13.2 - 15.5 g/dL   HCT 37.5 (L) 38.5 - 45.0 %   MCV 90.6 80.0 - 100.0 fL   MCH 31.6 27.0 - 33.0 pg   MCHC 34.9 32.0 - 36.0 g/dL   RDW 12.0 11.0 - 15.0 %   Platelets 271 140 - 400 Thousand/uL   MPV 11.0 7.5 - 12.5 fL   Neutro Abs 4,657 1,500 - 7,800 cells/uL   Lymphs Abs 2,379 850 - 3,900 cells/uL   WBC mixed population 515 200 -  950 cells/uL   Eosinophils Absolute 203 15 - 500 cells/uL   Basophils Absolute 47 0 - 200 cells/uL   Neutrophils Relative % 59.7 %   Total Lymphocyte 30.5 %   Monocytes Relative 6.6 %   Eosinophils Relative 2.6 %   Basophils Relative 0.6 %   Assessment/Plan: 1. Depressed mood Stable overall. Some increasing stressors but she is managing well. Will continue current regimen. She is to notify us if there is any deterioration in mood.   2. Visit for preventive health examination Depression screen negative. Health Maintenance reviewed. Preventive schedule discussed and handout given in AVS. Will obtain fasting labs  today.   3. Family history of thyroid disease Patient also with history of borderline low TSH levels. Will check panel today. - TSH - T4, free - T3, free - Thyroid peroxidase antibody   Leeanne Rio, PA-C

## 2017-09-04 NOTE — Patient Instructions (Signed)
Please go to the lab for blood work.   Our office will call you with your results unless you have chosen to receive results via MyChart.  If your blood work is normal we will follow-up each year for physicals and as scheduled for chronic medical problems.  If anything is abnormal we will treat accordingly and get you in for a follow-up.  Please keep me updated on your mood!   Preventive Care 18-39 Years, Female Preventive care refers to lifestyle choices and visits with your health care provider that can promote health and wellness. What does preventive care include?  A yearly physical exam. This is also called an annual well check.  Dental exams once or twice a year.  Routine eye exams. Ask your health care provider how often you should have your eyes checked.  Personal lifestyle choices, including: ? Daily care of your teeth and gums. ? Regular physical activity. ? Eating a healthy diet. ? Avoiding tobacco and drug use. ? Limiting alcohol use. ? Practicing safe sex. ? Taking vitamin and mineral supplements as recommended by your health care provider. What happens during an annual well check? The services and screenings done by your health care provider during your annual well check will depend on your age, overall health, lifestyle risk factors, and family history of disease. Counseling Your health care provider may ask you questions about your:  Alcohol use.  Tobacco use.  Drug use.  Emotional well-being.  Home and relationship well-being.  Sexual activity.  Eating habits.  Work and work Statistician.  Method of birth control.  Menstrual cycle.  Pregnancy history.  Screening You may have the following tests or measurements:  Height, weight, and BMI.  Diabetes screening. This is done by checking your blood sugar (glucose) after you have not eaten for a while (fasting).  Blood pressure.  Lipid and cholesterol levels. These may be checked every 5 years  starting at age 38.  Skin check.  Hepatitis C blood test.  Hepatitis B blood test.  Sexually transmitted disease (STD) testing.  BRCA-related cancer screening. This may be done if you have a family history of breast, ovarian, tubal, or peritoneal cancers.  Pelvic exam and Pap test. This may be done every 3 years starting at age 44. Starting at age 44, this may be done every 5 years if you have a Pap test in combination with an HPV test.  Discuss your test results, treatment options, and if necessary, the need for more tests with your health care provider. Vaccines Your health care provider may recommend certain vaccines, such as:  Influenza vaccine. This is recommended every year.  Tetanus, diphtheria, and acellular pertussis (Tdap, Td) vaccine. You may need a Td booster every 10 years.  Varicella vaccine. You may need this if you have not been vaccinated.  HPV vaccine. If you are 80 or younger, you may need three doses over 6 months.  Measles, mumps, and rubella (MMR) vaccine. You may need at least one dose of MMR. You may also need a second dose.  Pneumococcal 13-valent conjugate (PCV13) vaccine. You may need this if you have certain conditions and were not previously vaccinated.  Pneumococcal polysaccharide (PPSV23) vaccine. You may need one or two doses if you smoke cigarettes or if you have certain conditions.  Meningococcal vaccine. One dose is recommended if you are age 71-21 years and a first-year college student living in a residence hall, or if you have one of several medical conditions. You may also  need additional booster doses.  Hepatitis A vaccine. You may need this if you have certain conditions or if you travel or work in places where you may be exposed to hepatitis A.  Hepatitis B vaccine. You may need this if you have certain conditions or if you travel or work in places where you may be exposed to hepatitis B.  Haemophilus influenzae type b (Hib) vaccine. You  may need this if you have certain risk factors.  Talk to your health care provider about which screenings and vaccines you need and how often you need them. This information is not intended to replace advice given to you by your health care provider. Make sure you discuss any questions you have with your health care provider. Document Released: 05/21/2001 Document Revised: 12/13/2015 Document Reviewed: 01/24/2015 Elsevier Interactive Patient Education  [employer].

## 2017-09-22 ENCOUNTER — Other Ambulatory Visit: Payer: 59

## 2017-09-22 DIAGNOSIS — Z8349 Family history of other endocrine, nutritional and metabolic diseases: Secondary | ICD-10-CM | POA: Diagnosis not present

## 2017-09-23 LAB — T3, FREE: T3, Free: 3.8 pg/mL (ref 2.3–4.2)

## 2017-09-23 LAB — T4, FREE: Free T4: 0.86 ng/dL (ref 0.60–1.60)

## 2017-09-23 LAB — THYROID PEROXIDASE ANTIBODY: THYROID PEROXIDASE ANTIBODY: 1 [IU]/mL (ref <9)

## 2017-09-23 LAB — TSH: TSH: 0.52 u[IU]/mL (ref 0.35–4.50)

## 2017-10-21 ENCOUNTER — Other Ambulatory Visit: Payer: Self-pay | Admitting: Physician Assistant

## 2017-10-21 MED FILL — TRINTELLIX 20 MG TABLET: 20 | 90 days supply | Qty: 90 | Fill #2

## 2018-02-03 MED FILL — TRINTELLIX 20 MG TABLET: 20 | 90 days supply | Qty: 90 | Fill #3

## 2018-02-17 DIAGNOSIS — Z6821 Body mass index (BMI) 21.0-21.9, adult: Secondary | ICD-10-CM | POA: Diagnosis not present

## 2018-02-17 DIAGNOSIS — Z01419 Encounter for gynecological examination (general) (routine) without abnormal findings: Secondary | ICD-10-CM | POA: Diagnosis not present

## 2018-03-04 ENCOUNTER — Ambulatory Visit (INDEPENDENT_AMBULATORY_CARE_PROVIDER_SITE_OTHER): Payer: 59 | Admitting: Physician Assistant

## 2018-03-04 ENCOUNTER — Encounter: Payer: Self-pay | Admitting: Physician Assistant

## 2018-03-04 ENCOUNTER — Other Ambulatory Visit: Payer: Self-pay

## 2018-03-04 VITALS — BP 102/64 | HR 62 | Temp 98.1°F | Resp 14 | Ht 66.0 in | Wt 130.0 lb

## 2018-03-04 DIAGNOSIS — R2242 Localized swelling, mass and lump, left lower limb: Secondary | ICD-10-CM | POA: Diagnosis not present

## 2018-03-04 NOTE — Progress Notes (Signed)
Patient presents to clinic today c/o lump of left anterolateral thigh x 2+ years. Notes that the area has gotten larger in size. Is not painful or tender to palpation. Denies overlying skin changes. Denies notes trauma or injury in the area. Feels the area is very squishy. Denies history of skin cancer or other SQ mass.   Past Medical History:  Diagnosis Date  . Anxiety   . Asthma    PMH: exercise induced as a child  . Depression   . Family history of adverse reaction to anesthesia    Son PONV  . Gilberts syndrome   . Hemorrhoids   . Menstrual migraine    menstrual migraines  . PONV (postoperative nausea and vomiting)   . Umbilical hernia     Current Outpatient Medications on File Prior to Visit  Medication Sig Dispense Refill  . aspirin-acetaminophen-caffeine (EXCEDRIN MIGRAINE) 250-250-65 MG tablet Take 2 tablets by mouth daily as needed for headache or migraine.    Marland Kitchen buPROPion (WELLBUTRIN) 75 MG tablet Take 1 tablet (75 mg total) by mouth 2 (two) times daily. 180 tablet 1  . Multiple Vitamin (MULTIVITAMIN WITH MINERALS) TABS tablet Take 1 tablet by mouth daily.    . pantoprazole (PROTONIX) 40 MG tablet Take 1 tablet (40 mg total) by mouth daily. 90 tablet 3  . vortioxetine HBr (TRINTELLIX) 20 MG TABS Take 20 mg by mouth daily. 90 tablet 3  . cetirizine (ZYRTEC) 10 MG tablet Take 10 mg by mouth daily.     No current facility-administered medications on file prior to visit.     Allergies  Allergen Reactions  . Morphine Other (See Comments)    Possible anaphylaxis/airway swelling causing delayed extubation  . Cefaclor Hives    Family History  Problem Relation Age of Onset  . Pancreatic cancer Maternal Grandfather   . Parkinson's disease Mother   . Depression Mother   . Colon polyps Father   . Hypothyroidism Father   . Alzheimer's disease Paternal Grandfather   . Polycystic kidney disease Maternal Grandmother   . Other Brother        essential tremor  . Polycystic  kidney disease Maternal Uncle   . Heart disease Paternal Grandmother   . Diabetes Paternal Grandmother   . Cancer Maternal Uncle        [facility]  . Cancer Cousin        breast  . Cancer Cousin        breast    Social History   Socioeconomic History  . Marital status: Married    Spouse name: Not on file  . Number of children: 2  . Years of education: Not on file  . Highest education level: Not on file  Occupational History  . Occupation: physician  Social Needs  . Financial resource strain: Not on file  . Food insecurity:    Worry: Not on file    Inability: Not on file  . Transportation needs:    Medical: Not on file    Non-medical: Not on file  Tobacco Use  . Smoking status: Never Smoker  . Smokeless tobacco: Never Used  Substance and Sexual Activity  . Alcohol use: Yes    Alcohol/week: 0.0 standard drinks    Comment: less than 1 per day  . Drug use: No  . Sexual activity: Yes  Lifestyle  . Physical activity:    Days per week: Not on file    Minutes per session: Not on file  . Stress:  Not on file  Relationships  . Social connections:    Talks on phone: Not on file    Gets together: Not on file    Attends religious service: Not on file    Active member of club or organization: Not on file    Attends meetings of clubs or organizations: Not on file    Relationship status: Not on file  Other Topics Concern  . Not on file  Social History Narrative  . Not on file   Review of Systems - See HPI.  All other ROS are negative.  BP 102/64   Pulse 62   Temp 98.1 F (36.7 C) (Oral)   Resp 14   Ht 5\' 6"  (1.676 m)   Wt 130 lb (59 kg)   SpO2 99%   BMI 20.98 kg/m   Physical Exam  Constitutional: She appears well-developed and well-nourished.  HENT:  Head: Normocephalic and atraumatic.  Cardiovascular: Normal rate and regular rhythm.  Pulmonary/Chest: Effort normal.  Skin:     Vitals reviewed.    Assessment/Plan: 1. Mass of leg, left Suspect Lipoma but  giving questionable multi-lobe lesion versus smaller lesion adjacent to area of concern, will obtain US to get a better depiction. Mobile, soft characteristics of lesion are thankfully more consistent with a benign etiology.  - [place] LT LOWER EXTREM LTD SOFT TISSUE NON VASCULAR; Future   Leeanne Rio, PA-C

## 2018-03-10 DIAGNOSIS — Z1231 Encounter for screening mammogram for malignant neoplasm of breast: Secondary | ICD-10-CM | POA: Diagnosis not present

## 2018-03-17 ENCOUNTER — Other Ambulatory Visit: Payer: Self-pay | Admitting: General Practice

## 2018-03-17 MED ORDER — BUPROPION HCL 75 MG PO TABS: 75.0000 mg | ORAL_TABLET | Freq: Two times a day (BID) | ORAL | 1 refills | Status: DC

## 2018-03-17 MED FILL — buPROPion HCL 75 MG TABS: 75 | 90 days supply | Qty: 180 | Fill #0

## 2018-03-26 ENCOUNTER — Ambulatory Visit (HOSPITAL_BASED_OUTPATIENT_CLINIC_OR_DEPARTMENT_OTHER)
Admission: RE | Admit: 2018-03-26 | Discharge: 2018-03-26 | Disposition: A | Discharge status: Home / Self Care | Payer: 59 | Source: Ambulatory Visit | Attending: Physician Assistant | Admitting: Physician Assistant

## 2018-03-26 DIAGNOSIS — R2242 Localized swelling, mass and lump, left lower limb: Secondary | ICD-10-CM | POA: Diagnosis not present

## 2018-03-26 DIAGNOSIS — M25552 Pain in left hip: Secondary | ICD-10-CM | POA: Diagnosis not present

## 2018-04-22 ENCOUNTER — Other Ambulatory Visit: Payer: Self-pay | Admitting: Physician Assistant

## 2018-04-22 MED ORDER — OSELTAMIVIR PHOSPHATE 75 MG PO CAPS: 75.0000 mg | ORAL_CAPSULE | Freq: Every day | ORAL | 0 refills | Status: DC

## 2018-05-05 ENCOUNTER — Telehealth: Payer: Self-pay | Admitting: Physician Assistant

## 2018-05-05 MED ORDER — VORTIOXETINE HBR 20 MG PO TABS: 20.0000 mg | ORAL_TABLET | Freq: Every day | ORAL | 1 refills | Status: DC

## 2018-05-05 MED FILL — TRINTELLIX 20 MG TABLET: 20 | 90 days supply | Qty: 90 | Fill #0

## 2018-05-05 NOTE — Telephone Encounter (Signed)
Refill for Trintellix sent to pharmacy. Patient up-to-date on OV. Follow-up May for CPE.

## 2018-06-19 MED FILL — buPROPion HCL 75 MG TABS: 75 | 90 days supply | Qty: 180 | Fill #1

## 2018-08-25 ENCOUNTER — Ambulatory Visit (INDEPENDENT_AMBULATORY_CARE_PROVIDER_SITE_OTHER): Payer: 59 | Admitting: Physician Assistant

## 2018-08-25 ENCOUNTER — Encounter: Payer: Self-pay | Admitting: Physician Assistant

## 2018-08-25 ENCOUNTER — Other Ambulatory Visit: Payer: Self-pay

## 2018-08-25 VITALS — BP 98/60 | HR 70 | Temp 97.9°F | Resp 14

## 2018-08-25 DIAGNOSIS — D229 Melanocytic nevi, unspecified: Secondary | ICD-10-CM

## 2018-08-25 DIAGNOSIS — L820 Inflamed seborrheic keratosis: Secondary | ICD-10-CM | POA: Diagnosis not present

## 2018-08-25 NOTE — Progress Notes (Signed)
Patient presents to clinic today for removal of a hyperpigmented nevus of R anterolateral neck. Notes lesion has been present x 1 year. Has not changed in size or shape. Is darker than other moles which is concerning to her. Has personal and familial history of dysplastic nevi, not currently followed by Dermatology. Denies personal or familial history of melanoma or other skin cancers. Patient endorses use of sunscreen.   Past Medical History:  Diagnosis Date  . Anxiety   . Asthma    PMH: exercise induced as a child  . Depression   . Family history of adverse reaction to anesthesia    Son PONV  . Gilberts syndrome   . Hemorrhoids   . Menstrual migraine    menstrual migraines  . PONV (postoperative nausea and vomiting)   . Umbilical hernia     Current Outpatient Medications on File Prior to Visit  Medication Sig Dispense Refill  . aspirin-acetaminophen-caffeine (EXCEDRIN MIGRAINE) 250-250-65 MG tablet Take 2 tablets by mouth daily as needed for headache or migraine.    Marland Kitchen buPROPion (WELLBUTRIN) 75 MG tablet Take 1 tablet (75 mg total) by mouth 2 (two) times daily. 180 tablet 1  . cetirizine (ZYRTEC) 10 MG tablet Take 10 mg by mouth daily.    . Multiple Vitamin (MULTIVITAMIN WITH MINERALS) TABS tablet Take 1 tablet by mouth daily.    . pantoprazole (PROTONIX) 40 MG tablet Take 1 tablet (40 mg total) by mouth daily. 90 tablet 3  . vortioxetine HBr (TRINTELLIX) 20 MG TABS tablet Take 1 tablet (20 mg total) by mouth daily. 90 tablet 1   No current facility-administered medications on file prior to visit.     Allergies  Allergen Reactions  . Morphine Other (See Comments)    Possible anaphylaxis/airway swelling causing delayed extubation  . Cefaclor Hives    Family History  Problem Relation Age of Onset  . Pancreatic cancer Maternal Grandfather   . Parkinson's disease Mother   . Depression Mother   . Colon polyps Father   . Hypothyroidism Father   . Alzheimer's disease  Paternal Grandfather   . Polycystic kidney disease Maternal Grandmother   . Other Brother        essential tremor  . Polycystic kidney disease Maternal Uncle   . Heart disease Paternal Grandmother   . Diabetes Paternal Grandmother   . Cancer Maternal Uncle        [facility]  . Cancer Cousin        breast  . Cancer Cousin        breast    Social History   Socioeconomic History  . Marital status: Married    Spouse name: Not on file  . Number of children: 2  . Years of education: Not on file  . Highest education level: Not on file  Occupational History  . Occupation: physician  Social Needs  . Financial resource strain: Not on file  . Food insecurity:    Worry: Not on file    Inability: Not on file  . Transportation needs:    Medical: Not on file    Non-medical: Not on file  Tobacco Use  . Smoking status: Never Smoker  . Smokeless tobacco: Never Used  Substance and Sexual Activity  . Alcohol use: Yes    Alcohol/week: 0.0 standard drinks    Comment: less than 1 per day  . Drug use: No  . Sexual activity: Yes  Lifestyle  . Physical activity:    Days per  week: Not on file    Minutes per session: Not on file  . Stress: Not on file  Relationships  . Social connections:    Talks on phone: Not on file    Gets together: Not on file    Attends religious service: Not on file    Active member of club or organization: Not on file    Attends meetings of clubs or organizations: Not on file    Relationship status: Not on file  Other Topics Concern  . Not on file  Social History Narrative  . Not on file   Review of Systems - See HPI.  All other ROS are negative.  BP 98/60   Pulse 70   Temp 97.9 F (36.6 C) (Skin)   Resp 14   SpO2 98%   Physical Exam Vitals signs reviewed.  Constitutional:      Appearance: Normal appearance.  HENT:     Head: Normocephalic and atraumatic.     Right Ear: Tympanic membrane normal.     Left Ear: Tympanic membrane normal.  Pulmonary:      Effort: Pulmonary effort is normal.  Skin:      Neurological:     General: No focal deficit present.     Mental Status: She is alert and oriented to person, place, and time.  Psychiatric:        Mood and Affect: Mood normal.    Assessment/Plan: 1. Atypical nevus Hyperpigmented and significantly darker than other nevi noted of skin. Raised borders but symmetrical. Patient is in agreement with shave biopsy today. Procedure reviewed with patient and verbal and written consent obtained.  Area prepped with betadine swab and allowed to dry. Local anesthesia applied at base of lesion with 1 mL of 1% lidocaine with epinephrine. Shave biopsy performed with 10 blade. Chemical cautery stick used immediately to achieve quick hemostasis. Minimal bleeding. Patient tolerated very well. Spot bandage with TAB ointment applied. Home care discussed. Return precautions discussed. Sample sent to pathology for review. Will call patient with results.    - Dermatology pathology([place])   Leeanne Rio, PA-C

## 2018-08-25 NOTE — Progress Notes (Deleted)
ns

## 2018-09-17 MED FILL — TRINTELLIX 20 MG TABLET: 20 | 90 days supply | Qty: 90 | Fill #1

## 2018-10-27 ENCOUNTER — Other Ambulatory Visit: Payer: Self-pay | Admitting: General Practice

## 2018-10-27 MED ORDER — BUPROPION HCL 75 MG PO TABS: 75.0000 mg | ORAL_TABLET | Freq: Two times a day (BID) | ORAL | 1 refills | Status: DC

## 2018-10-27 MED FILL — buPROPion HCL 75 MG TABS: 75 | 90 days supply | Qty: 180 | Fill #0

## 2018-10-27 NOTE — Telephone Encounter (Signed)
Pt called in asking if her wellbutrin could be filled to local pharmacy. Wink for refill?

## 2018-11-19 ENCOUNTER — Other Ambulatory Visit: Payer: Self-pay | Admitting: Physician Assistant

## 2018-11-19 ENCOUNTER — Telehealth: Payer: Self-pay | Admitting: General Practice

## 2018-11-19 MED ORDER — ZOLPIDEM TARTRATE 10 MG PO TABS: 10.0000 mg | ORAL_TABLET | Freq: Every evening | ORAL | 0 refills | Status: DC | PRN

## 2018-11-19 NOTE — Telephone Encounter (Signed)
Pt is calling asking if there is any way she could have something for sleep. Stress levels have been high at work, with the pandemic, and with school starting. Just this week found out mom likely has cancer. Sleep has always been an issue but even worse now. Has been on Trazodone, Restoril (didn't like how they made her feel) and Belsomra samples (didn't have enough to determine if they were effective). She was given Ambien 10mg  when her son was in the NICU and this worked well. She is aware of possible side effects but does not sleep alone so feels she would be safe if any were to occur. She doesn't want this to be a long term solution but needs help in the short term.

## 2018-11-19 NOTE — Telephone Encounter (Signed)
Pt notified. Will schedule follow up appt.

## 2018-11-19 NOTE — Telephone Encounter (Signed)
I will sent in a script for the Ambien to use for short-term insomnia.  Will need to reconsider Belsomra again if more long-term medication is needed. Would have her follow-up with me in 1 week via phone to let me know how things are going. I have sent in a one month supply of medication for her.

## 2018-12-30 ENCOUNTER — Other Ambulatory Visit: Payer: Self-pay | Admitting: Emergency Medicine

## 2018-12-30 DIAGNOSIS — R4589 Other symptoms and signs involving emotional state: Secondary | ICD-10-CM

## 2018-12-30 MED ORDER — VORTIOXETINE HBR 20 MG PO TABS: 20.0000 mg | ORAL_TABLET | Freq: Every day | ORAL | 1 refills | Status: DC

## 2018-12-30 MED FILL — TRINTELLIX 20 MG TABLET: 20 | 90 days supply | Qty: 90 | Fill #0

## 2019-02-05 ENCOUNTER — Other Ambulatory Visit: Payer: Self-pay | Admitting: Emergency Medicine

## 2019-02-05 DIAGNOSIS — R4589 Other symptoms and signs involving emotional state: Secondary | ICD-10-CM

## 2019-02-05 MED ORDER — BUPROPION HCL 75 MG PO TABS: 75.0000 mg | ORAL_TABLET | Freq: Two times a day (BID) | ORAL | 1 refills | Status: DC

## 2019-02-05 MED FILL — buPROPion HCL 75 MG TABS: 75 | 90 days supply | Qty: 180 | Fill #0

## 2019-02-23 DIAGNOSIS — H524 Presbyopia: Secondary | ICD-10-CM | POA: Diagnosis not present

## 2019-02-25 ENCOUNTER — Telehealth: Payer: Self-pay | Admitting: Emergency Medicine

## 2019-02-25 DIAGNOSIS — H18821 Corneal disorder due to contact lens, right eye: Secondary | ICD-10-CM

## 2019-02-25 MED ORDER — POLYMYXIN B-TRIMETHOPRIM 10000-0.1 UNIT/ML-% OP SOLN: OPHTHALMIC | 0 refills | Status: DC

## 2019-02-25 NOTE — Telephone Encounter (Signed)
Spoke with patient concerning symptoms. Conjunctival injection noted of lateral R eye with increased tearing. No current purulent drainage but with worsening symptoms will start Polytrim OP, sent to local pharmacy. Patient understands plan.

## 2019-02-25 NOTE — Telephone Encounter (Signed)
Patient states on Tuesday patient went to eye doctor for yearly eye exam. She got a fitted for contacts. When she went to remove the contacts she got distracted and hit her eye. She has been having pain and burning of the right eye. She contacted her eye doctor and was given a medication but she states it was" CRAZY expensive." The right eye is still hurting like hell and wanted an abx

## 2019-04-07 ENCOUNTER — Other Ambulatory Visit: Payer: Self-pay | Admitting: Emergency Medicine

## 2019-04-07 DIAGNOSIS — R4589 Other symptoms and signs involving emotional state: Secondary | ICD-10-CM

## 2019-04-07 MED ORDER — VORTIOXETINE HBR 20 MG PO TABS: 20.0000 mg | ORAL_TABLET | Freq: Every day | ORAL | 3 refills | Status: DC

## 2019-04-07 MED FILL — TRINTELLIX 20 MG TABLET: 20 | 90 days supply | Qty: 90 | Fill #0

## 2019-06-12 MED FILL — buPROPion HCL 75 MG TABS: 75 | 30 days supply | Qty: 60 | Fill #1

## 2019-08-05 ENCOUNTER — Other Ambulatory Visit: Payer: Self-pay | Admitting: General Practice

## 2019-08-05 DIAGNOSIS — R4589 Other symptoms and signs involving emotional state: Secondary | ICD-10-CM

## 2019-08-05 MED ORDER — VORTIOXETINE HBR 20 MG PO TABS: 20.0000 mg | ORAL_TABLET | Freq: Every day | ORAL | 3 refills | Status: DC

## 2019-08-05 MED ORDER — BUPROPION HCL 75 MG PO TABS: 75.0000 mg | ORAL_TABLET | Freq: Two times a day (BID) | ORAL | 1 refills | Status: DC

## 2019-08-05 MED FILL — buPROPion HCL 75 MG TABS: 75 | 90 days supply | Qty: 180 | Fill #0

## 2019-08-05 MED FILL — TRINTELLIX 20 MG TABLET: 20 | 90 days supply | Qty: 90 | Fill #0

## 2019-08-17 ENCOUNTER — Other Ambulatory Visit: Payer: Self-pay

## 2019-08-17 ENCOUNTER — Encounter: Payer: Self-pay | Admitting: Physician Assistant

## 2019-08-17 ENCOUNTER — Ambulatory Visit (INDEPENDENT_AMBULATORY_CARE_PROVIDER_SITE_OTHER): Payer: 59 | Admitting: Physician Assistant

## 2019-08-17 VITALS — BP 98/70 | HR 78 | Temp 98.1°F | Resp 14 | Ht 66.0 in | Wt 130.0 lb

## 2019-08-17 DIAGNOSIS — N946 Dysmenorrhea, unspecified: Secondary | ICD-10-CM | POA: Diagnosis not present

## 2019-08-17 MED ORDER — NORETHIN ACE-ETH ESTRAD-FE 1-20 MG-MCG(24) PO TABS: 1.0000 | ORAL_TABLET | Freq: Every day | ORAL | 3 refills | Status: DC

## 2019-08-17 NOTE — Progress Notes (Signed)
Patient presents to clinic today to discuss ongoing symptoms of dysmenorrhea.  Patient endorses she has a standard 21-day cycle, typically having menstrual bleeding for 3 days with a few subsequent days of occasional spotting.  Denies any history of menorrhagia or painful menstrual cycle.  Over the past several months has noted significant pain with ovulation, also associated with breast tenderness.  Notes the breast tenderness typically resolves within a day of onset of menstrual flow.  Pain and discomfort typically takes a few more days but is significant.  Is also associated with fatigue, mood changes, headache and occasional vasomotor symptom.  Denies any lightheadedness or dizziness.  Denies shortness of breath.  Denies night sweats.  Patient notes her mother remained on birth control so unsure exactly when mother began menopause.  No other known family history of early onset menopause.  Patient states she was on birth control previously but has not been on since after the birth of her daughter.  Patient is physically fit.  No known history of DVT or PE.  Blood pressure has always been stable for her.  Would like to discuss restarting OCP.    Past Medical History:  Diagnosis Date  . Anxiety   . Asthma    PMH: exercise induced as a child  . Depression   . Family history of adverse reaction to anesthesia    Son PONV  . Gilberts syndrome   . Hemorrhoids   . Menstrual migraine    menstrual migraines  . PONV (postoperative nausea and vomiting)   . Umbilical hernia     Current Outpatient Medications on File Prior to Visit  Medication Sig Dispense Refill  . aspirin-acetaminophen-caffeine (EXCEDRIN MIGRAINE) 250-250-65 MG tablet Take 2 tablets by mouth daily as needed for headache or migraine.    Marland Kitchen buPROPion (WELLBUTRIN) 75 MG tablet Take 1 tablet (75 mg total) by mouth 2 (two) times daily. Pt never received the full #90 on last refill. Will come to [place] to pick up today. 180 tablet 1  .  cetirizine (ZYRTEC) 10 MG tablet Take 10 mg by mouth daily.    . Multiple Vitamin (MULTIVITAMIN WITH MINERALS) TABS tablet Take 1 tablet by mouth daily.    Marland Kitchen vortioxetine HBr (TRINTELLIX) 20 MG TABS tablet Take 1 tablet (20 mg total) by mouth daily. 90 tablet 3  . pantoprazole (PROTONIX) 40 MG tablet Take 1 tablet (40 mg total) by mouth daily. (Patient not taking: Reported on 08/17/2019) 90 tablet 3   No current facility-administered medications on file prior to visit.    Allergies  Allergen Reactions  . Morphine Other (See Comments)    Possible anaphylaxis/airway swelling causing delayed extubation  . Cefaclor Hives    Family History  Problem Relation Age of Onset  . Pancreatic cancer Maternal Grandfather   . Parkinson's disease Mother   . Depression Mother   . Colon polyps Father   . Hypothyroidism Father   . Alzheimer's disease Paternal Grandfather   . Polycystic kidney disease Maternal Grandmother   . Other Brother        essential tremor  . Polycystic kidney disease Maternal Uncle   . Heart disease Paternal Grandmother   . Diabetes Paternal Grandmother   . Cancer Maternal Uncle        [facility]  . Cancer Cousin        breast  . Cancer Cousin        breast    Social History   Socioeconomic History  . Marital  status: Married    Spouse name: Not on file  . Number of children: 2  . Years of education: Not on file  . Highest education level: Not on file  Occupational History  . Occupation: physician  Tobacco Use  . Smoking status: Never Smoker  . Smokeless tobacco: Never Used  Substance and Sexual Activity  . Alcohol use: Yes    Alcohol/week: 0.0 standard drinks    Comment: less than 1 per day  . Drug use: No  . Sexual activity: Yes  Other Topics Concern  . Not on file  Social History Narrative  . Not on file   Social Determinants of Health   Financial Resource Strain:   . Difficulty of Paying Living Expenses:   Food Insecurity:   . Worried About Ship broker in the Last Year:   . Arboriculturist in the Last Year:   Transportation Needs:   . Film/video editor (Medical):   Marland Kitchen Lack of Transportation (Non-Medical):   Physical Activity:   . Days of Exercise per Week:   . Minutes of Exercise per Session:   Stress:   . Feeling of Stress :   Social Connections:   . Frequency of Communication with Friends and Family:   . Frequency of Social Gatherings with Friends and Family:   . Attends Religious Services:   . Active Member of Clubs or Organizations:   . Attends Archivist Meetings:   Marland Kitchen Marital Status:    Review of Systems - See HPI.  All other ROS are negative.  BP 98/70   Pulse 78   Temp 98.1 F (36.7 C) (Temporal)   Resp 14   Ht 5\' 6"  (1.676 m)   Wt 130 lb (59 kg)   SpO2 99%   BMI 20.98 kg/m   Physical Exam Vitals reviewed.  Constitutional:      Appearance: Normal appearance.  HENT:     Head: Normocephalic and atraumatic.  Eyes:     Conjunctiva/sclera: Conjunctivae normal.  Neurological:     Mental Status: She is alert. Mental status is at baseline.  Psychiatric:        Mood and Affect: Mood normal.    Assessment/Plan: 1. Dysmenorrhea Significant and causing impact on quality of life.  Patient does have history of migraine but without aura.  Low risk of clotting.  Non-smoker, healthy individual overall.  We will have her restarted on OCP.  Rx Loestrin once daily.  Discussed with her that this will take a few months to get into her system to make a change.  She is understanding of this. - Norethindrone Acetate-Ethinyl Estrad (LOESTRIN 24) 1-20 MG-MCG(24) tablet; Take 1 tablet by mouth daily.  Dispense: 3 Package; Refill: 3  This visit occurred during the SARS-CoV-2 public health emergency.  Safety protocols were in place, including screening questions prior to the visit, additional usage of staff PPE, and extensive cleaning of exam room while observing appropriate contact time as indicated for  disinfecting solutions.     Leeanne Rio, PA-C

## 2019-08-27 ENCOUNTER — Other Ambulatory Visit: Payer: Self-pay | Admitting: Physician Assistant

## 2019-08-27 ENCOUNTER — Telehealth: Payer: Self-pay | Admitting: Physician Assistant

## 2019-08-27 DIAGNOSIS — Z1231 Encounter for screening mammogram for malignant neoplasm of breast: Secondary | ICD-10-CM

## 2019-08-27 NOTE — Telephone Encounter (Signed)
Patient requesting order for [facility] Patient usually has done through her GYN but they are booked out through the summer. Order placed.

## 2019-08-30 ENCOUNTER — Other Ambulatory Visit: Payer: Self-pay | Admitting: General Practice

## 2019-08-30 MED ORDER — KETOCONAZOLE 2 % EX SHAM: 1.0000 "application " | MEDICATED_SHAMPOO | CUTANEOUS | 1 refills | Status: DC

## 2019-09-14 ENCOUNTER — Ambulatory Visit
Admission: RE | Admit: 2019-09-14 | Discharge: 2019-09-14 | Disposition: A | Discharge status: Home / Self Care | Payer: 59 | Source: Ambulatory Visit | Attending: Physician Assistant | Admitting: Physician Assistant

## 2019-09-14 ENCOUNTER — Other Ambulatory Visit: Payer: Self-pay

## 2019-09-14 DIAGNOSIS — Z1231 Encounter for screening mammogram for malignant neoplasm of breast: Secondary | ICD-10-CM

## 2019-11-01 DIAGNOSIS — Z03818 Encounter for observation for suspected exposure to other biological agents ruled out: Secondary | ICD-10-CM | POA: Diagnosis not present

## 2019-11-01 DIAGNOSIS — Z20822 Contact with and (suspected) exposure to covid-19: Secondary | ICD-10-CM | POA: Diagnosis not present

## 2019-11-02 ENCOUNTER — Other Ambulatory Visit: Payer: Self-pay | Admitting: General Practice

## 2019-11-02 DIAGNOSIS — R4589 Other symptoms and signs involving emotional state: Secondary | ICD-10-CM

## 2019-11-02 MED ORDER — VORTIOXETINE HBR 20 MG PO TABS: 20.0000 mg | ORAL_TABLET | Freq: Every day | ORAL | 3 refills | Status: DC

## 2019-11-02 MED ORDER — BUPROPION HCL 75 MG PO TABS: 75.0000 mg | ORAL_TABLET | Freq: Two times a day (BID) | ORAL | 1 refills | Status: DC

## 2019-11-02 MED FILL — TRINTELLIX 20 MG TABLET: 20 | 90 days supply | Qty: 90 | Fill #0

## 2019-11-02 MED FILL — BUPROPION HCL 75 MG TABS: 75 | 90 days supply | Qty: 180 | Fill #0

## 2019-11-09 ENCOUNTER — Other Ambulatory Visit: Payer: Self-pay | Admitting: General Practice

## 2019-11-09 DIAGNOSIS — N946 Dysmenorrhea, unspecified: Secondary | ICD-10-CM

## 2019-11-09 MED ORDER — NORETHIN ACE-ETH ESTRAD-FE 1-20 MG-MCG(24) PO TABS: 1.0000 | ORAL_TABLET | Freq: Every day | ORAL | 3 refills | Status: DC

## 2019-11-09 MED FILL — TARINA 24 FE 1-20 MG-MCG(24: 1-20 | 84 days supply | Qty: 84 | Fill #0

## 2019-11-10 ENCOUNTER — Other Ambulatory Visit: Payer: Self-pay | Admitting: Physician Assistant

## 2019-11-10 MED ORDER — AZITHROMYCIN 250 MG PO TABS
ORAL_TABLET | ORAL | 0 refills | Status: DC
Start: 2019-11-10 — End: 2020-08-09

## 2019-11-30 DIAGNOSIS — H903 Sensorineural hearing loss, bilateral: Secondary | ICD-10-CM | POA: Diagnosis not present

## 2020-02-01 MED FILL — BUPROPION HCL 75 MG TABS: 75 | 90 days supply | Qty: 180 | Fill #1

## 2020-02-01 MED FILL — TRINTELLIX 20 MG TABLET: 20 | 90 days supply | Qty: 90 | Fill #1

## 2020-02-01 MED FILL — TARINA 24 FE 1-20 MG-MCG(24: 1-20 | 84 days supply | Qty: 84 | Fill #1

## 2020-04-13 MED FILL — TARINA 24 FE 1-20 MG-MCG(24: 1-20 | 84 days supply | Qty: 84 | Fill #2

## 2020-05-02 ENCOUNTER — Other Ambulatory Visit: Payer: Self-pay | Admitting: Emergency Medicine

## 2020-05-02 DIAGNOSIS — R4589 Other symptoms and signs involving emotional state: Secondary | ICD-10-CM

## 2020-05-02 MED ORDER — BUPROPION HCL 75 MG PO TABS: 75.0000 mg | ORAL_TABLET | Freq: Two times a day (BID) | ORAL | 3 refills | Status: DC

## 2020-05-02 MED ORDER — VORTIOXETINE HBR 20 MG PO TABS: 20.0000 mg | ORAL_TABLET | Freq: Every day | ORAL | 3 refills | Status: DC

## 2020-05-02 MED FILL — TRINTELLIX 20 MG TABLET: 20 | 90 days supply | Qty: 90 | Fill #0

## 2020-05-02 MED FILL — BUPROPION HCL 75 MG TABS: 75 | 90 days supply | Qty: 180 | Fill #0

## 2020-06-28 ENCOUNTER — Other Ambulatory Visit (HOSPITAL_BASED_OUTPATIENT_CLINIC_OR_DEPARTMENT_OTHER): Payer: Self-pay

## 2020-07-17 ENCOUNTER — Other Ambulatory Visit (HOSPITAL_BASED_OUTPATIENT_CLINIC_OR_DEPARTMENT_OTHER): Payer: Self-pay

## 2020-07-17 MED FILL — Norethindrone Ace-Ethinyl Estradiol-FE Tab 1 MG-20 MCG (24): ORAL | 84 days supply | Qty: 84 | Fill #0 | Status: AC

## 2020-08-01 ENCOUNTER — Other Ambulatory Visit (HOSPITAL_BASED_OUTPATIENT_CLINIC_OR_DEPARTMENT_OTHER): Payer: Self-pay

## 2020-08-08 ENCOUNTER — Other Ambulatory Visit (HOSPITAL_BASED_OUTPATIENT_CLINIC_OR_DEPARTMENT_OTHER): Payer: Self-pay

## 2020-08-08 MED FILL — Bupropion HCl Tab 75 MG: ORAL | 90 days supply | Qty: 180 | Fill #0 | Status: AC

## 2020-08-08 MED FILL — Vortioxetine HBr Tab 20 MG (Base Equiv): ORAL | 90 days supply | Qty: 90 | Fill #0 | Status: AC

## 2020-08-09 ENCOUNTER — Ambulatory Visit (INDEPENDENT_AMBULATORY_CARE_PROVIDER_SITE_OTHER): Payer: 59 | Admitting: Family Medicine

## 2020-08-09 ENCOUNTER — Other Ambulatory Visit (HOSPITAL_BASED_OUTPATIENT_CLINIC_OR_DEPARTMENT_OTHER): Payer: Self-pay

## 2020-08-09 ENCOUNTER — Encounter: Payer: Self-pay | Admitting: Family Medicine

## 2020-08-09 ENCOUNTER — Other Ambulatory Visit: Payer: Self-pay

## 2020-08-09 VITALS — BP 111/64 | HR 69 | Temp 98.3°F | Ht 66.0 in | Wt 136.0 lb

## 2020-08-09 DIAGNOSIS — Z3041 Encounter for surveillance of contraceptive pills: Secondary | ICD-10-CM | POA: Diagnosis not present

## 2020-08-09 DIAGNOSIS — N946 Dysmenorrhea, unspecified: Secondary | ICD-10-CM | POA: Insufficient documentation

## 2020-08-09 DIAGNOSIS — R4589 Other symptoms and signs involving emotional state: Secondary | ICD-10-CM | POA: Diagnosis not present

## 2020-08-09 DIAGNOSIS — Z0000 Encounter for general adult medical examination without abnormal findings: Secondary | ICD-10-CM

## 2020-08-09 DIAGNOSIS — Z1231 Encounter for screening mammogram for malignant neoplasm of breast: Secondary | ICD-10-CM

## 2020-08-09 HISTORY — DX: Dysmenorrhea, unspecified: N94.6

## 2020-08-09 MED ORDER — VORTIOXETINE HBR 20 MG PO TABS
ORAL_TABLET | Freq: Every day | ORAL | 3 refills | Status: DC
  Filled 2020-08-09: qty 90, fill #0
  Filled 2020-11-10: qty 90, 90d supply, fill #0
  Filled 2021-02-09: qty 90, 90d supply, fill #1
  Filled 2021-05-16: qty 90, 90d supply, fill #2

## 2020-08-09 MED ORDER — NORETHIN ACE-ETH ESTRAD-FE 1-20 MG-MCG(24) PO TABS
1.0000 | ORAL_TABLET | Freq: Every day | ORAL | 3 refills | Status: DC
  Filled 2020-08-09 – 2020-10-13 (×2): qty 84, 84d supply, fill #0
  Filled 2021-01-26: qty 84, 84d supply, fill #1
  Filled 2021-04-20: qty 84, 84d supply, fill #2
  Filled 2021-07-12: qty 84, 84d supply, fill #3

## 2020-08-09 MED ORDER — BUPROPION HCL 75 MG PO TABS
ORAL_TABLET | Freq: Two times a day (BID) | ORAL | 3 refills | Status: DC
  Filled 2020-11-10: qty 180, 90d supply, fill #0
  Filled 2021-02-09: qty 180, 90d supply, fill #1
  Filled 2021-05-16: qty 180, 90d supply, fill #2

## 2020-08-09 NOTE — Progress Notes (Signed)
This visit occurred during the SARS-CoV-2 public health emergency.  Safety protocols were in place, including screening questions prior to the visit, additional usage of staff PPE, and extensive cleaning of exam room while observing appropriate contact time as indicated for disinfecting solutions.    Patient ID: [name], female  DOB: 07-02-1975, 45 y.o.   MRN: [id number] Patient Care Team    Relationship Specialty Notifications Start End  Ma Hillock, DO PCP - General Family Medicine  08/09/20   Dian Queen, MD Consulting Physician Obstetrics and Gynecology  09/04/17     Chief Complaint  Patient presents with  . Annual Exam    Labs with pt for Doctors day    Subjective: [name] is a 45 y.o.  Female  present for CPE/CMC/toc. All past medical history, surgical history, allergies, family history, immunizations, medications and social history were updated in the electronic medical record today. All recent labs, ED visits and hospitalizations within the last year were reviewed.  Dysmenorrhea/Encounter for birth control pills maintenance Patient started back on birth control pills last year for dysmenorrhea.  She states she is noticing some hot flashes, mostly at night.  Her mother started perimenopause/menopausal symptoms earlier in life/40s as well.  She would like to continue with the birth control pills at this time.  Her Pap is up-to-date.  She follows with gynecology for cervical cancer screenings.  Depressed mood She reports she has been's prescribed Trintellix 20 mg daily and Wellbutrin 75 mg twice daily for quite some time.  This regimen has worked well for her for many years and she would like to continue current prescriptions.  Health maintenance:  Colonoscopy: No fhx. Screen at 9 Mammogram: completed:09/14/2019, [facility]> ordered Cervical cancer screening: last pap: 2020> Dr. Helane Rima Immunizations: tdap UTD 2015, Influenza UTD 2021 (encouraged  yearly),Covid x3 Infectious disease screening: HIV completed during pregnancy.  Hep C declined DEXA:routine screen Assistive device: none Oxygen use: none Patient has a Dental home. Hospitalizations/ED visits: reviewed  Depression screen [facility] 2/9 08/09/2020 08/25/2018 03/04/2018 09/04/2017  Decreased Interest 0 0 0 1  Down, Depressed, Hopeless 0 0 0 1  PHQ - 2 Score 0 0 0 2  Altered sleeping - 0 0 3  Tired, decreased energy - 0 0 2  Change in appetite - 0 0 0  Feeling bad or failure about yourself  - 0 0 0  Trouble concentrating - 0 0 1  Moving slowly or fidgety/restless - 0 0 0  Suicidal thoughts - 0 0 0  PHQ-9 Score - 0 0 8  Difficult doing work/chores - Not difficult at all Not difficult at all Somewhat difficult   No flowsheet data found.       Immunization History  Administered Date(s) Administered  . Influenza Split 01/03/2012, 01/17/2020  . Influenza,inj,Quad PF,6+ Mos 01/20/2014, 12/28/2014, 12/29/2015, 12/13/2016, 12/30/2017  . PFIZER(Purple Top)SARS-COV-2 Vaccination 04/07/2019, 04/26/2019, 01/07/2020  . Tdap 11/06/2013     Past Medical History:  Diagnosis Date  . Anxiety   . Asthma    PMH: exercise induced as a child  . Bruising 08/07/2010  . Depression   . Family history of adverse reaction to anesthesia    Son PONV  . Gilberts syndrome   . Hemorrhoids   . Menstrual migraine    menstrual migraines  . PONV (postoperative nausea and vomiting)   . POSTPARTUM DEPRESSION 09/26/2009   Qualifier: Diagnosis of  By: Inda Castle FNP, Melissa S   . Umbilical hernia  Allergies  Allergen Reactions  . Morphine Other (See Comments)    Possible anaphylaxis/airway swelling causing delayed extubation  . Cefaclor Hives   Past Surgical History:  Procedure Laterality Date  . ANKLE SURGERY    . APPENDECTOMY    . CESAREAN SECTION     x 2  . GANGLION CYST EXCISION     X 2  . HERNIA REPAIR     VENTRAL  . INSERTION OF MESH N/A 01/07/2017   Procedure: INSERTION OF  MESH;  Surgeon: Ralene Ok, MD;  Location: [facility];  Service: General;  Laterality: N/A;  . INTUSSUSCEPTION REPAIR    . MOUTH SURGERY    . SHOULDER ARTHROSCOPY     x 2 right  . TONSILLECTOMY AND ADENOIDECTOMY    . TUBAL LIGATION    . TYMPANOSTOMY TUBE PLACEMENT    . UMBILICAL HERNIA REPAIR N/A 01/07/2017   Procedure: LAPAROSCOPIC UMBILICAL HERNIA REPAIR WITH MESH;  Surgeon: Ralene Ok, MD;  Location[facility];  Service: General;  Laterality: N/A;  . WISDOM TOOTH EXTRACTION     Family History  Problem Relation Age of Onset  . Pancreatic cancer Maternal Grandfather   . Parkinson's disease Mother   . Depression Mother   . Colon polyps Father   . Hypothyroidism Father   . Leukemia Father   . Alzheimer's disease Paternal Grandfather   . Polycystic kidney disease Maternal Grandmother   . Other Brother        essential tremor  . Polycystic kidney disease Maternal Uncle   . Heart disease Paternal Grandmother   . Diabetes Paternal Grandmother   . Cancer Maternal Uncle        [facility]  . Cancer Cousin        breast  . Breast cancer Cousin   . Cancer Cousin        breast  . Breast cancer Cousin    Social History   Social History Narrative  . Not on file    Allergies as of 08/09/2020      Reactions   Morphine Other (See Comments)   Possible anaphylaxis/airway swelling causing delayed extubation   Cefaclor Hives      Medication List       Accurate as of Aug 09, 2020  3:56 PM. If you have any questions, ask your nurse or doctor.        STOP taking these medications   azithromycin 250 MG tablet Commonly known as: ZITHROMAX Stopped by: Howard Pouch, DO   ketoconazole 2 % shampoo Commonly known as: NIZORAL Stopped by: Howard Pouch, DO   pantoprazole 40 MG tablet Commonly known as: PROTONIX Stopped by: Howard Pouch, DO     TAKE these medications   aspirin-acetaminophen-caffeine 250-250-65 MG tablet Commonly known as: EXCEDRIN MIGRAINE Take 2 tablets by mouth daily  as needed for headache or migraine.   buPROPion 75 MG tablet Commonly known as: WELLBUTRIN TAKE 1 TABLET BY MOUTH 2 TIMES DAILY   cetirizine 10 MG tablet Commonly known as: ZYRTEC Take 10 mg by mouth daily.   multivitamin with minerals Tabs tablet Take 1 tablet by mouth daily.   Norethindrone Acetate-Ethinyl Estrad-FE 1-20 MG-MCG(24) tablet Commonly known as: LOESTRIN 24 FE TAKE 1 TABLET BY MOUTH ONCE A DAY   vortioxetine HBr 20 MG Tabs tablet Commonly known as: TRINTELLIX TAKE 1 TABLET BY MOUTH ONCE A DAY       All past medical history, surgical history, allergies, family history, immunizations andmedications were updated in the EMR  today and reviewed under the history and medication portions of their EMR.     No results found for this or any previous visit (from the past 2160 hour(s)).  MM 3D SCREEN BREAST BILATERAL  Result Date: 09/15/2019 CLINICAL DATA:  Screening. EXAM: DIGITAL SCREENING BILATERAL MAMMOGRAM WITH TOMO AND CAD COMPARISON:  Previous exam(s). ACR Breast Density Category c: The breast tissue is heterogeneously dense, which may obscure small masses. FINDINGS: There are no findings suspicious for malignancy. Images were processed with CAD. IMPRESSION: No mammographic evidence of malignancy. A result letter of this screening mammogram will be mailed directly to the patient. RECOMMENDATION: Screening mammogram in one year. (Code:SM-B-01Y) BI-RADS CATEGORY  1: Negative. Electronically Signed   By: Lajean Manes M.D.   On: 09/15/2019 16:00     ROS: 14 pt review of systems performed and negative (unless mentioned in an HPI)  Objective: BP 111/64   Pulse 69   Temp 98.3 F (36.8 C) (Oral)   Ht 5\' 6"  (1.676 m)   Wt 136 lb (61.7 kg)   SpO2 98%   BMI 21.95 kg/m  Gen: Afebrile. No acute distress. Nontoxic in appearance, well-developed, well-nourished, pleasant female HENT: AT. [place]. Bilateral TM visualized and normal in appearance, normal external auditory canal. MMM,  no oral lesions, adequate dentition. Bilateral nares within normal limits. Throat without erythema, ulcerations or exudates.  No cough on exam, no hoarseness on exam. Eyes:Pupils Equal Round Reactive to light, Extraocular movements intact,  Conjunctiva without redness, discharge or icterus. Neck/lymp/endocrine: Supple, no lymphadenopathy, no thyromegaly CV: RRR no murmur, no edema, +2/4 P posterior tibialis pulses.  Chest: CTAB, no wheeze, rhonchi or crackles.  Normal respiratory effort.  Good air movement. Abd: Soft.  Flat. NTND. BS present.  No masses palpated. No hepatosplenomegaly. No rebound tenderness or guarding. Skin: No rashes, purpura or petechiae. Warm and well-perfused. Skin intact. Neuro/Msk: Normal gait. PERLA. EOMi. Alert. Oriented x3.  Cranial nerves II through XII intact. Muscle strength 5/5 upper/lower extremity. DTRs equal bilaterally. Psych: Normal affect, dress and demeanor. Normal speech. Normal thought content and judgment.  No exam data present  Assessment/plan: [name] is a 45 y.o. female present for CPE/CMC/TOC Encounter for screening mammogram for malignant neoplasm of breast - MM 3D SCREEN BREAST BILATERAL; Future  Dysmenorrhea/Encounter for birth control pills maintenance Stable.  She is having hot flashes/perimenopausal symptoms. Continue Loestrin 24 FE Follow yearly with CPE and per routine to gynecology for cervical cancer screening  Depressed mood Stable. Continue Trintellix 20 mg daily Continue Wellbutrin 75 mg twice daily Follow-up yearly, unless needed sooner.  Routine general medical examination at a health care facility Patient was encouraged to exercise greater than 150 minutes a week. Patient was encouraged to choose a diet filled with fresh fruits and vegetables, and lean meats. AVS provided to patient today for education/recommendation on gender specific health and safety maintenance. Reviewed CBC, CMP, TSH, A1c and lipids from  [place].  All within normal limits. Colonoscopy: No fhx. Screen at 22 Mammogram: completed:09/14/2019, [facility]> ordered Cervical cancer screening: last pap: 2020> Dr. Helane Rima Immunizations: tdap UTD 2015, Influenza UTD 2021 (encouraged yearly),Covid x3 Infectious disease screening: HIV completed during pregnancy.  Hep C declined DEXA:routine screen  Return in about 1 year (around 08/09/2021) for CPE (30 min).   Orders Placed This Encounter  Procedures  . MM 3D SCREEN BREAST BILATERAL   Meds ordered this encounter  Medications  . vortioxetine HBr (TRINTELLIX) 20 MG TABS tablet    Sig: TAKE 1  TABLET BY MOUTH ONCE A DAY    Dispense:  90 tablet    Refill:  3  . Norethindrone Acetate-Ethinyl Estrad-FE (LOESTRIN 24 FE) 1-20 MG-MCG(24) tablet    Sig: TAKE 1 TABLET BY MOUTH ONCE A DAY    Dispense:  84 tablet    Refill:  3  . buPROPion (WELLBUTRIN) 75 MG tablet    Sig: TAKE 1 TABLET BY MOUTH 2 TIMES DAILY    Dispense:  180 tablet    Refill:  3   Referral Orders  No referral(s) requested today     Electronically signed by: Howard Pouch, [place]

## 2020-08-09 NOTE — Patient Instructions (Signed)
Great to see you today.  I have refilled the medication(s) we provide.   If labs were collected, we will inform you of lab results once received either by echart message or telephone call.   - echart message- for normal results that have been seen by the patient already.   - telephone call: abnormal results or if patient has not viewed results in their echart.    Health Maintenance, Female Adopting a healthy lifestyle and getting preventive care are important in promoting health and wellness. Ask your health care provider about:  The right schedule for you to have regular tests and exams.  Things you can do on your own to prevent diseases and keep yourself healthy. What should I know about diet, weight, and exercise? Eat a healthy diet  Eat a diet that includes plenty of vegetables, fruits, low-fat dairy products, and lean protein.  Do not eat a lot of foods that are high in solid fats, added sugars, or sodium.   Maintain a healthy weight Body mass index (BMI) is used to identify weight problems. It estimates body fat based on height and weight. Your health care provider can help determine your BMI and help you achieve or maintain a healthy weight. Get regular exercise Get regular exercise. This is one of the most important things you can do for your health. Most adults should:  Exercise for at least 150 minutes each week. The exercise should increase your heart rate and make you sweat (moderate-intensity exercise).  Do strengthening exercises at least twice a week. This is in addition to the moderate-intensity exercise.  Spend less time sitting. Even light physical activity can be beneficial. Watch cholesterol and blood lipids Have your blood tested for lipids and cholesterol at 43 years of age, then have this test every 5 years. Have your cholesterol levels checked more often if:  Your lipid or cholesterol levels are high.  You are older than 43 years of age.  You are at  high risk for heart disease. What should I know about cancer screening? Depending on your health history and family history, you may need to have cancer screening at various ages. This may include screening for:  Breast cancer.  Cervical cancer.  Colorectal cancer.  Skin cancer.  Lung cancer. What should I know about heart disease, diabetes, and high blood pressure? Blood pressure and heart disease  High blood pressure causes heart disease and increases the risk of stroke. This is more likely to develop in people who have high blood pressure readings, are of African descent, or are overweight.  Have your blood pressure checked: ? Every 3-5 years if you are 80-47 years of age. ? Every year if you are 60 years old or older. Diabetes Have regular diabetes screenings. This checks your fasting blood sugar level. Have the screening done:  Once every three years after age 24 if you are at a normal weight and have a low risk for diabetes.  More often and at a younger age if you are overweight or have a high risk for diabetes. What should I know about preventing infection? Hepatitis B If you have a higher risk for hepatitis B, you should be screened for this virus. Talk with your health care provider to find out if you are at risk for hepatitis B infection. Hepatitis C Testing is recommended for:  Everyone born from [birth year] through 1963.  Anyone with known risk factors for hepatitis C. Sexually transmitted infections (STIs)  Get screened for  STIs, including gonorrhea and chlamydia, if: ? You are sexually active and are younger than 43 years of age. ? You are older than 43 years of age and your health care provider tells you that you are at risk for this type of infection. ? Your sexual activity has changed since you were last screened, and you are at increased risk for chlamydia or gonorrhea. Ask your health care provider if you are at risk.  Ask your health care provider about whether  you are at high risk for HIV. Your health care provider may recommend a prescription medicine to help prevent HIV infection. If you choose to take medicine to prevent HIV, you should first get tested for HIV. You should then be tested every 3 months for as long as you are taking the medicine. Pregnancy  If you are about to stop having your period (premenopausal) and you may become pregnant, seek counseling before you get pregnant.  Take 400 to 800 micrograms (mcg) of folic acid every day if you become pregnant.  Ask for birth control (contraception) if you want to prevent pregnancy. Osteoporosis and menopause Osteoporosis is a disease in which the bones lose minerals and strength with aging. This can result in bone fractures. If you are 65 years old or older, or if you are at risk for osteoporosis and fractures, ask your health care provider if you should:  Be screened for bone loss.  Take a calcium or vitamin D supplement to lower your risk of fractures.  Be given hormone replacement therapy (HRT) to treat symptoms of menopause. Follow these instructions at home: Lifestyle  Do not use any products that contain nicotine or tobacco, such as cigarettes, e-cigarettes, and chewing tobacco. If you need help quitting, ask your health care provider.  Do not use street drugs.  Do not share needles.  Ask your health care provider for help if you need support or information about quitting drugs. Alcohol use  Do not drink alcohol if: ? Your health care provider tells you not to drink. ? You are pregnant, may be pregnant, or are planning to become pregnant.  If you drink alcohol: ? Limit how much you use to 0-1 drink a day. ? Limit intake if you are breastfeeding.  Be aware of how much alcohol is in your drink. In the [place] one drink equals one 12 oz bottle of beer (355 mL), one 5 oz glass of wine (148 mL), or one 1 oz glass of hard liquor (44 mL). General instructions  Schedule regular  health, dental, and eye exams.  Stay current with your vaccines.  Tell your health care provider if: ? You often feel depressed. ? You have ever been abused or do not feel safe at home. Summary  Adopting a healthy lifestyle and getting preventive care are important in promoting health and wellness.  Follow your health care provider's instructions about healthy diet, exercising, and getting tested or screened for diseases.  Follow your health care provider's instructions on monitoring your cholesterol and blood pressure. This information is not intended to replace advice given to you by your health care provider. Make sure you discuss any questions you have with your health care provider. Document Revised: 03/18/2018 Document Reviewed: 03/18/2018 Elsevier Patient Education  2021 Elsevier Inc.  

## 2020-10-13 ENCOUNTER — Other Ambulatory Visit (HOSPITAL_BASED_OUTPATIENT_CLINIC_OR_DEPARTMENT_OTHER): Payer: Self-pay

## 2020-10-13 ENCOUNTER — Telehealth: Payer: Self-pay | Admitting: Family Medicine

## 2020-10-13 NOTE — Telephone Encounter (Signed)
Received a refill request from patient for her birth control pills.  Per EMR review BCP was refilled 08/09/2020 to her pharmacy.  Patient will call her pharmacy and check to see if they have any new prescriptions under this provider is normal.  Possibly a change in prescription numbers since this is the first time I prescribed for her.  She will let us know if they received this.

## 2020-10-13 NOTE — Telephone Encounter (Signed)
Noted  

## 2020-10-26 ENCOUNTER — Other Ambulatory Visit: Payer: Self-pay

## 2020-10-26 ENCOUNTER — Other Ambulatory Visit: Payer: Self-pay | Admitting: Family Medicine

## 2020-10-26 ENCOUNTER — Telehealth: Payer: Self-pay

## 2020-10-26 ENCOUNTER — Emergency Department (HOSPITAL_BASED_OUTPATIENT_CLINIC_OR_DEPARTMENT_OTHER): Payer: 59

## 2020-10-26 ENCOUNTER — Encounter: Payer: Self-pay | Admitting: Internal Medicine

## 2020-10-26 ENCOUNTER — Encounter (HOSPITAL_BASED_OUTPATIENT_CLINIC_OR_DEPARTMENT_OTHER): Payer: Self-pay

## 2020-10-26 ENCOUNTER — Encounter: Payer: Self-pay | Admitting: Family Medicine

## 2020-10-26 ENCOUNTER — Encounter: Payer: Self-pay | Admitting: *Deleted

## 2020-10-26 ENCOUNTER — Emergency Department (HOSPITAL_BASED_OUTPATIENT_CLINIC_OR_DEPARTMENT_OTHER)
Admission: EM | Admit: 2020-10-26 | Discharge: 2020-10-26 | Disposition: A | Discharge status: Home / Self Care | Payer: 59 | Attending: Emergency Medicine | Admitting: Emergency Medicine

## 2020-10-26 DIAGNOSIS — J45909 Unspecified asthma, uncomplicated: Secondary | ICD-10-CM | POA: Diagnosis not present

## 2020-10-26 DIAGNOSIS — R197 Diarrhea, unspecified: Secondary | ICD-10-CM

## 2020-10-26 DIAGNOSIS — K922 Gastrointestinal hemorrhage, unspecified: Secondary | ICD-10-CM | POA: Diagnosis not present

## 2020-10-26 DIAGNOSIS — R110 Nausea: Secondary | ICD-10-CM | POA: Insufficient documentation

## 2020-10-26 DIAGNOSIS — R1030 Lower abdominal pain, unspecified: Secondary | ICD-10-CM

## 2020-10-26 DIAGNOSIS — R109 Unspecified abdominal pain: Secondary | ICD-10-CM | POA: Diagnosis not present

## 2020-10-26 LAB — COMPREHENSIVE METABOLIC PANEL
ALT: 14 U/L (ref 0–44)
AST: 22 U/L (ref 15–41)
Albumin: 4.6 g/dL (ref 3.5–5.0)
Alkaline Phosphatase: 35 U/L — ABNORMAL LOW (ref 38–126)
Anion gap: 9 (ref 5–15)
BUN: 20 mg/dL (ref 6–20)
CO2: 25 mmol/L (ref 22–32)
Calcium: 9.6 mg/dL (ref 8.9–10.3)
Chloride: 105 mmol/L (ref 98–111)
Creatinine, Ser: 0.96 mg/dL (ref 0.44–1.00)
GFR, Estimated: >60 mL/min (ref >60)
Glucose, Bld: 127 mg/dL — ABNORMAL HIGH (ref 70–99)
Potassium: 4 mmol/L (ref 3.5–5.1)
Sodium: 139 mmol/L (ref 135–145)
Total Bilirubin: 0.8 mg/dL (ref 0.3–1.2)
Total Protein: 7.8 g/dL (ref 6.5–8.1)

## 2020-10-26 LAB — CBC WITH DIFFERENTIAL/PLATELET
Abs Immature Granulocytes: 0.17 10*3/uL — ABNORMAL HIGH (ref 0.00–0.07)
Basophils Absolute: 0.1 10*3/uL (ref 0.0–0.1)
Basophils Relative: 0 %
Eosinophils Absolute: 0.2 10*3/uL (ref 0.0–0.5)
Eosinophils Relative: 1 %
HCT: 43.2 % (ref 36.0–46.0)
Hemoglobin: 14.3 g/dL (ref 12.0–15.0)
Immature Granulocytes: 1 %
Lymphocytes Relative: 11 %
Lymphs Abs: 1.9 10*3/uL (ref 0.7–4.0)
MCH: 31 pg (ref 26.0–34.0)
MCHC: 33.1 g/dL (ref 30.0–36.0)
MCV: 93.7 fL (ref 80.0–100.0)
Monocytes Absolute: 0.9 10*3/uL (ref 0.1–1.0)
Monocytes Relative: 5 %
Neutro Abs: 14.5 10*3/uL — ABNORMAL HIGH (ref 1.7–7.7)
Neutrophils Relative %: 82 %
Platelets: 317 10*3/uL (ref 150–400)
RBC: 4.61 MIL/uL (ref 3.87–5.11)
RDW: 12.2 % (ref 11.5–15.5)
WBC: 17.7 10*3/uL — ABNORMAL HIGH (ref 4.0–10.5)
nRBC: 0 % (ref 0.0–0.2)

## 2020-10-26 LAB — PREGNANCY, URINE: Preg Test, Ur: NEGATIVE

## 2020-10-26 LAB — URINALYSIS, ROUTINE W REFLEX MICROSCOPIC
Bilirubin Urine: NEGATIVE
Glucose, UA: NEGATIVE mg/dL
Hgb urine dipstick: NEGATIVE
Ketones, ur: NEGATIVE mg/dL
Leukocytes,Ua: NEGATIVE
Nitrite: NEGATIVE
Protein, ur: NEGATIVE mg/dL
Specific Gravity, Urine: 1.023 (ref 1.005–1.030)
pH: 5 (ref 5.0–8.0)

## 2020-10-26 LAB — LIPASE, BLOOD: Lipase: 15 U/L (ref 11–51)

## 2020-10-26 MED ORDER — HYDROMORPHONE HCL 1 MG/ML IJ SOLN
1.0000 mg | Freq: Once | INTRAMUSCULAR | Status: AC
  Administered 2020-10-26: 1 mg via INTRAVENOUS
  Filled 2020-10-26: qty 1

## 2020-10-26 MED ORDER — FENTANYL CITRATE (PF) 100 MCG/2ML IJ SOLN
50.0000 ug | Freq: Once | INTRAMUSCULAR | Status: DC
  Filled 2020-10-26: qty 2

## 2020-10-26 MED ORDER — ONDANSETRON HCL 4 MG/2ML IJ SOLN
4.0000 mg | Freq: Once | INTRAMUSCULAR | Status: AC
  Administered 2020-10-26: 4 mg via INTRAVENOUS
  Filled 2020-10-26: qty 2

## 2020-10-26 MED ORDER — DICYCLOMINE HCL 10 MG/ML IM SOLN
20.0000 mg | Freq: Once | INTRAMUSCULAR | Status: AC
  Administered 2020-10-26: 20 mg via INTRAMUSCULAR
  Filled 2020-10-26: qty 2

## 2020-10-26 MED ORDER — IOHEXOL 350 MG/ML SOLN
100.0000 mL | Freq: Once | INTRAVENOUS | Status: AC | PRN
  Administered 2020-10-26: 50 mL via INTRAVENOUS

## 2020-10-26 MED ORDER — DICYCLOMINE HCL 20 MG PO TABS: 20.0000 mg | ORAL_TABLET | Freq: Four times a day (QID) | ORAL | 1 refills | Status: DC | PRN

## 2020-10-26 MED ORDER — SODIUM CHLORIDE 0.9 % IV BOLUS
1000.0000 mL | Freq: Once | INTRAVENOUS | Status: AC
  Administered 2020-10-26: 1000 mL via INTRAVENOUS

## 2020-10-26 NOTE — Telephone Encounter (Signed)
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 Carlean Purl for tomorrow at 4:00.  She is aware I will send the instructions via her myChart and she will call if she has questions.   Dx bloody diarrhea

## 2020-10-26 NOTE — Progress Notes (Signed)
Order change requested by lab. - changed to [facility].

## 2020-10-26 NOTE — Telephone Encounter (Signed)
Please advise. And review attachments below

## 2020-10-26 NOTE — Progress Notes (Signed)
Currently being evaluated by GI for bloody diarrhea. Will order GI pathogen panel today.

## 2020-10-26 NOTE — ED Provider Notes (Signed)
[facility] Provider Note: Carolyn Spurling, MD, FACEP  CSN: [id number] MRN: [id number] ARRIVAL: 10/26/20 at [place]: [place]  Abdominal Pain   HISTORY OF PRESENT ILLNESS  10/26/20 2:08 AM [name] is a 45 y.o. female who has had a fairly longstanding (about a year) of postprandial abdominal cramping and diarrhea.  She has never had severe pain and she has never had bloody diarrhea.  She is here with severe lower abdominal cramping that began about 8:30 PM.  She describes this pain as feeling like a combination of severe menstrual cramps and severe bowel cramps.  Initially she was passing stool but subsequently was passing bloody stool.  She rates her pain as a 10 out of 10.  Her pain is somewhat worse with movement and with palpation of her lower abdomen.  She has had nausea with this but no vomiting.  She felt her abdomen was distended earlier but that has improved.  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   Past Medical History:  Diagnosis Date   Anxiety    Asthma    PMH: exercise induced as a child   Bruising 08/07/2010   Depression    Family history of adverse reaction to anesthesia    Son PONV   Gilberts syndrome    Hemorrhoids    Menstrual migraine    menstrual migraines   PONV (postoperative nausea and vomiting)    POSTPARTUM DEPRESSION 09/26/2009   Qualifier: Diagnosis of  By: Inda Castle FNP, Melissa S    Umbilical hernia     Past Surgical History:  Procedure Laterality Date   ANKLE SURGERY     APPENDECTOMY     CESAREAN SECTION     x 2   GANGLION CYST EXCISION     X 2   HERNIA REPAIR     VENTRAL   INSERTION OF MESH N/A 01/07/2017   Procedure: INSERTION OF MESH;  Surgeon: Ralene Ok, MD;  Location: [facility];  Service: General;  Laterality: N/A;   INTUSSUSCEPTION REPAIR     MOUTH SURGERY     SHOULDER ARTHROSCOPY     x 2 right   TONSILLECTOMY AND ADENOIDECTOMY     TUBAL LIGATION     TYMPANOSTOMY  TUBE PLACEMENT     UMBILICAL HERNIA REPAIR N/A 01/07/2017   Procedure: LAPAROSCOPIC UMBILICAL HERNIA REPAIR WITH MESH;  Surgeon: Ralene Ok, MD;  [facility];  Service: General;  Laterality: N/A;   WISDOM TOOTH EXTRACTION      Family History  Problem Relation Age of Onset   Pancreatic cancer Maternal Grandfather    Parkinson's disease Mother    Depression Mother    Colon polyps Father    Hypothyroidism Father    Leukemia Father    Alzheimer's disease Paternal Grandfather    Polycystic kidney disease Maternal Grandmother    Other Brother        essential tremor   Polycystic kidney disease Maternal Uncle    Heart disease Paternal Grandmother    Diabetes Paternal Grandmother    Cancer Maternal Uncle        GBM   Cancer Cousin        breast   Breast cancer Cousin    Cancer Cousin        breast   Breast cancer Cousin     Social History   Tobacco Use   Smoking status: Never   Smokeless tobacco: Never  Vaping Use  Vaping Use: Never used  Substance Use Topics   Alcohol use: Yes    Alcohol/week: 0.0 standard drinks    Comment: less than 1 per day   Drug use: No    Prior to Admission medications   Medication Sig Start Date End Date Taking? Authorizing Provider  aspirin-acetaminophen-caffeine (EXCEDRIN MIGRAINE) [phone] MG tablet Take 2 tablets by mouth daily as needed for headache or migraine.    [provider]  buPROPion (WELLBUTRIN) 75 MG tablet TAKE 1 TABLET BY MOUTH 2 TIMES DAILY 08/09/20 08/09/21  Kuneff, Renee A, DO  cetirizine (ZYRTEC) 10 MG tablet Take 10 mg by mouth daily.    [provider]  Multiple Vitamin (MULTIVITAMIN WITH MINERALS) TABS tablet Take 1 tablet by mouth daily.    [provider]  Norethindrone Acetate-Ethinyl Estrad-FE (LOESTRIN 24 FE) 1-20 MG-MCG(24) tablet TAKE 1 TABLET BY MOUTH ONCE A DAY 08/09/20 08/09/21  Kuneff, Renee A, DO  vortioxetine HBr (TRINTELLIX) 20 MG TABS tablet TAKE 1 TABLET BY MOUTH ONCE A DAY  08/09/20 08/09/21  Kuneff, Renee A, DO    Allergies Morphine and Cefaclor   REVIEW OF SYSTEMS  Negative except as noted here or in the History of Present Illness.   PHYSICAL EXAMINATION  Initial Vital Signs Blood pressure 122/67, pulse 72, temperature 98.3 F (36.8 C), temperature source Oral, resp. rate 18, height 5\' 6"  (1.676 m), weight 61.2 kg, SpO2 100 %.  Examination General: Well-developed, well-nourished female in no acute distress but appears uncomfortable; appearance consistent with age of record HENT: normocephalic; atraumatic Eyes: pupils equal, round and reactive to light; extraocular muscles intact Neck: supple Heart: regular rate and rhythm Lungs: clear to auscultation bilaterally Abdomen: soft; nondistended; lower abdominal tenderness; bowel sounds present Extremities: No deformity; full range of motion Neurologic: Awake, alert and oriented; motor function intact in all extremities and symmetric; no facial droop Skin: Warm and dry Psychiatric: Grimacing   RESULTS  Summary of this visit's results, reviewed and interpreted by myself:   EKG Interpretation  Date/Time:    Ventricular Rate:    PR Interval:    QRS Duration:   QT Interval:    QTC Calculation:   R Axis:     Text Interpretation:         Laboratory Studies: Results for orders placed or performed during the hospital encounter of 10/26/20 (from the past 24 hour(s))  Comprehensive metabolic panel     Status: Abnormal   Collection Time: 10/26/20  2:09 AM  Result Value Ref Range   Sodium 139 135 - 145 mmol/L   Potassium 4.0 3.5 - 5.1 mmol/L   Chloride 105 98 - 111 mmol/L   CO2 25 22 - 32 mmol/L   Glucose, Bld 127 (H) 70 - 99 mg/dL   BUN 20 6 - 20 mg/dL   Creatinine, Ser 0.96 0.44 - 1.00 mg/dL   Calcium 9.6 8.9 - 10.3 mg/dL   Total Protein 7.8 6.5 - 8.1 g/dL   Albumin 4.6 3.5 - 5.0 g/dL   AST 22 15 - 41 U/L   ALT 14 0 - 44 U/L   Alkaline Phosphatase 35 (L) 38 - 126 U/L   Total Bilirubin 0.8  0.3 - 1.2 mg/dL   GFR, Estimated >60 >60 mL/min   Anion gap 9 5 - 15  Lipase, blood     Status: None   Collection Time: 10/26/20  2:09 AM  Result Value Ref Range   Lipase 15 11 - 51 U/L  CBC with  Diff     Status: Abnormal   Collection Time: 10/26/20  2:09 AM  Result Value Ref Range   WBC 17.7 (H) 4.0 - 10.5 K/uL   RBC 4.61 3.87 - 5.11 MIL/uL   Hemoglobin 14.3 12.0 - 15.0 g/dL   HCT 43.2 36.0 - 46.0 %   MCV 93.7 80.0 - 100.0 fL   MCH 31.0 26.0 - 34.0 pg   MCHC 33.1 30.0 - 36.0 g/dL   RDW 12.2 11.5 - 15.5 %   Platelets 317 150 - 400 K/uL   nRBC 0.0 0.0 - 0.2 %   Neutrophils Relative % 82 %   Neutro Abs 14.5 (H) 1.7 - 7.7 K/uL   Lymphocytes Relative 11 %   Lymphs Abs 1.9 0.7 - 4.0 K/uL   Monocytes Relative 5 %   Monocytes Absolute 0.9 0.1 - 1.0 K/uL   Eosinophils Relative 1 %   Eosinophils Absolute 0.2 0.0 - 0.5 K/uL   Basophils Relative 0 %   Basophils Absolute 0.1 0.0 - 0.1 K/uL   Immature Granulocytes 1 %   Abs Immature Granulocytes 0.17 (H) 0.00 - 0.07 K/uL  Urinalysis, Routine w reflex microscopic Urine, Clean Catch     Status: None   Collection Time: 10/26/20  2:09 AM  Result Value Ref Range   Color, Urine YELLOW YELLOW   APPearance CLEAR CLEAR   Specific Gravity, Urine 1.023 1.005 - 1.030   pH 5.0 5.0 - 8.0   Glucose, UA NEGATIVE NEGATIVE mg/dL   Hgb urine dipstick NEGATIVE NEGATIVE   Bilirubin Urine NEGATIVE NEGATIVE   Ketones, ur NEGATIVE NEGATIVE mg/dL   Protein, ur NEGATIVE NEGATIVE mg/dL   Nitrite NEGATIVE NEGATIVE   Leukocytes,Ua NEGATIVE NEGATIVE  Pregnancy, urine     Status: None   Collection Time: 10/26/20  2:09 AM  Result Value Ref Range   Preg Test, Ur NEGATIVE NEGATIVE   Imaging Studies: CT ABDOMEN PELVIS W CONTRAST  Result Date: 10/26/2020 CLINICAL DATA:  Nausea, acute nonlocalized abdominal pain EXAM: CT ABDOMEN AND PELVIS WITH CONTRAST TECHNIQUE: Multidetector CT imaging of the abdomen and pelvis was performed using the standard protocol  following bolus administration of intravenous contrast. CONTRAST:  69mL OMNIPAQUE IOHEXOL 350 MG/ML SOLN COMPARISON:  None. FINDINGS: Lower chest: The visualized lung bases are clear. The visualized heart and pericardium are unremarkable. Hepatobiliary: No focal liver abnormality is seen. No gallstones, gallbladder wall thickening, or biliary dilatation. Pancreas: Unremarkable Spleen: Unremarkable Adrenals/Urinary Tract: Adrenal glands are unremarkable. Kidneys are normal, without renal calculi, focal lesion, or hydronephrosis. Bladder is unremarkable. Stomach/Bowel: The stomach, small bowel, and large bowel are unremarkable. Appendix absent. No free intraperitoneal gas or fluid. Vascular/Lymphatic: No significant vascular findings are present. No enlarged abdominal or pelvic lymph nodes. Reproductive: Pelvic organs are unremarkable. Other: Umbilical hernia repair with mesh has been performed. No recurrent abdominal wall hernia. The rectum is unremarkable Musculoskeletal: No acute bone abnormality. No lytic or blastic bone lesion. IMPRESSION: No acute intra-abdominal pathology identified. No definite radiographic explanation for the patient's reported symptoms. Electronically Signed   By: Fidela Salisbury MD   On: 10/26/2020 03:24    ED COURSE and MDM  Nursing notes, initial and subsequent vitals signs, including pulse oximetry, reviewed and interpreted by myself.  Vitals:   10/26/20 0152 10/26/20 0153 10/26/20 0321 10/26/20 0430  BP: 122/67  119/87 108/72  Pulse: 72  68 67  Resp: 18  18 18   Temp: 98.3 F (36.8 C)  98.3 F (36.8 C)   TempSrc:  Oral  Oral   SpO2: 100%  100% 100%  Weight:  61.2 kg    Height:  5\' 6"  (1.676 m)     Medications  fentaNYL (SUBLIMAZE) injection 50 mcg (50 mcg Intravenous Not Given 10/26/20 0211)  ondansetron (ZOFRAN) injection 4 mg (4 mg Intravenous Given 10/26/20 0211)  HYDROmorphone (DILAUDID) injection 1 mg (1 mg Intravenous Given 10/26/20 0218)  iohexol (OMNIPAQUE) 350  MG/ML injection 100 mL (50 mLs Intravenous Contrast Given 10/26/20 0259)  sodium chloride 0.9 % bolus 1,000 mL (1,000 mLs Intravenous New Bag/Given 10/26/20 0320)  dicyclomine (BENTYL) injection 20 mg (20 mg Intramuscular Given 10/26/20 0401)   5:03 AM Patient feeling significantly better after Bentyl and Dilaudid.  She is not having any further diarrhea or rectal bleeding.  As noted above she has had been having similar, but not as severe, episodes of abdominal cramping with diarrhea for for nearly a year.  She has not sought medical care for this but she was highly encouraged to contact [facility] for further evaluation and work-up.  In the meantime we will treat her with Bentyl orally and have her return if symptoms worsen.   PROCEDURES  Procedures   ED DIAGNOSES     ICD-10-CM   1. Lower abdominal pain  R10.30     2. Lower GI bleeding  K92.2          Maor Meckel, Jenny Reichmann, MD 10/26/20 [phone]

## 2020-10-26 NOTE — Progress Notes (Signed)
See prior note. Order changed to stat as planned colonoscopy tomorrow.

## 2020-10-26 NOTE — ED Triage Notes (Signed)
Pt reports abd pain and nausea x tonight after had dinner

## 2020-10-27 ENCOUNTER — Ambulatory Visit (AMBULATORY_SURGERY_CENTER): Payer: 59 | Admitting: Internal Medicine

## 2020-10-27 ENCOUNTER — Encounter: Payer: Self-pay | Admitting: Internal Medicine

## 2020-10-27 VITALS — BP 89/60 | HR 82 | Resp 12

## 2020-10-27 DIAGNOSIS — J45909 Unspecified asthma, uncomplicated: Secondary | ICD-10-CM | POA: Diagnosis not present

## 2020-10-27 DIAGNOSIS — K55039 Acute (reversible) ischemia of large intestine, extent unspecified: Secondary | ICD-10-CM | POA: Diagnosis not present

## 2020-10-27 DIAGNOSIS — R197 Diarrhea, unspecified: Secondary | ICD-10-CM

## 2020-10-27 DIAGNOSIS — K921 Melena: Secondary | ICD-10-CM | POA: Diagnosis not present

## 2020-10-27 DIAGNOSIS — K559 Vascular disorder of intestine, unspecified: Secondary | ICD-10-CM | POA: Diagnosis not present

## 2020-10-27 DIAGNOSIS — K5730 Diverticulosis of large intestine without perforation or abscess without bleeding: Secondary | ICD-10-CM | POA: Diagnosis not present

## 2020-10-27 MED ORDER — SODIUM CHLORIDE 0.9 % IV SOLN
500.0000 mL | Freq: Once | INTRAVENOUS | Status: DC
Start: 2020-10-27 — End: 2020-11-30

## 2020-10-27 NOTE — Patient Instructions (Addendum)
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  I took biopsies.  Some sigmoid diverticulosis seen also.  No other abnormalities.  I hope you feel better soon - this should improve over next few days and is not dangerous and usually does not recur.  Use the dicyclomine and stick to a light diet and advance as tolerated.  I appreciate the opportunity to care for you. Gatha Mayer, MD, FACG  YOU HAD AN ENDOSCOPIC PROCEDURE TODAY AT [facility]:   Refer to the procedure report that was given to you for any specific questions about what was found during the examination.  If the procedure report does not answer your questions, please call your gastroenterologist to clarify.  If you requested that your care partner not be given the details of your procedure findings, then the procedure report has been included in a sealed envelope for you to review at your convenience later.  YOU SHOULD EXPECT: Some feelings of bloating in the abdomen. Passage of more gas than usual.  Walking can help get rid of the air that was put into your GI tract during the procedure and reduce the bloating. If you had a lower endoscopy (such as a colonoscopy or flexible sigmoidoscopy) you may notice spotting of blood in your stool or on the toilet paper. If you underwent a bowel prep for your procedure, you may not have a normal bowel movement for a few days.  Please Note:  You might notice some irritation and congestion in your nose or some drainage.  This is from the oxygen used during your procedure.  There is no need for concern and it should clear up in a day or so.  SYMPTOMS TO REPORT IMMEDIATELY:  Following lower endoscopy (colonoscopy or flexible sigmoidoscopy):  Excessive amounts of blood in the stool  Significant tenderness or worsening of abdominal pains  Swelling of the abdomen that is new, acute  Fever of 100F or higher   Fever of 100F or higher  Black,  tarry-looking stools  For urgent or emergent issues, a gastroenterologist can be reached at any hour by calling [phone]. Do not use MyChart messaging for urgent concerns.    DIET:  We do recommend a small meal at first, but then you may proceed to your regular diet.  Drink plenty of fluids but you should avoid alcoholic beverages for 24 hours.  ACTIVITY:  You should plan to take it easy for the rest of today and you should NOT DRIVE or use heavy machinery until tomorrow (because of the sedation medicines used during the test).    FOLLOW UP: Our staff will call the number listed on your records 48-72 hours following your procedure to check on you and address any questions or concerns that you may have regarding the information given to you following your procedure. If we do not reach you, we will leave a message.  We will attempt to reach you two times.  During this call, we will ask if you have developed any symptoms of COVID 19. If you develop any symptoms (ie: fever, flu-like symptoms, shortness of breath, cough etc.) before then, please call [phone].  If you test positive for Covid 19 in the 2 weeks post procedure, please call and report this information to [place].    If any biopsies were taken you will be contacted by phone or by letter within the next 1-3 weeks.  Please call us at [phone] if you  have not heard about the biopsies in 3 weeks.    SIGNATURES/CONFIDENTIALITY: You and/or your care partner have signed paperwork which will be entered into your electronic medical record.  These signatures attest to the fact that that the information above on your After Visit Summary has been reviewed and is understood.  Full responsibility of the confidentiality of this discharge information lies with you and/or your care-partner.

## 2020-10-27 NOTE — Progress Notes (Signed)
Report given to PACU, vss 

## 2020-10-27 NOTE — Progress Notes (Signed)
Called to room to assist during endoscopic procedure.  Patient ID and intended procedure confirmed with present staff. Received instructions for my participation in the procedure from the performing physician.  

## 2020-10-27 NOTE — Progress Notes (Signed)
Pt's states no medical or surgical changes since previsit or office visit. 

## 2020-10-27 NOTE — Op Note (Signed)
[facility] Patient Name: [name] Procedure Date: 10/27/2020 3:31 PM MRN: [id number] Endoscopist: Gatha Mayer , MD Age: 45 Referring MD:  Date of Birth: July 01, 1975 Gender: Female Account #: [id number] Procedure:                Colonoscopy Indications:              Clinically significant diarrhea of unexplained                            origin, Hematochezia Medicines:                Propofol per Anesthesia, Monitored Anesthesia Care Procedure:                Pre-Anesthesia Assessment:                           - Prior to the procedure, a History and Physical                            was performed, and patient medications and                            allergies were reviewed. The patient's tolerance of                            previous anesthesia was also reviewed. The risks                            and benefits of the procedure and the sedation                            options and risks were discussed with the patient.                            All questions were answered, and informed consent                            was obtained. Prior Anticoagulants: The patient has                            taken no previous anticoagulant or antiplatelet                            agents. ASA Grade Assessment: II - A patient with                            mild systemic disease. After reviewing the risks                            and benefits, the patient was deemed in                            satisfactory condition to undergo the procedure.  After obtaining informed consent, the colonoscope                            was passed under direct vision. Throughout the                            procedure, the patient's blood pressure, pulse, and                            oxygen saturations were monitored continuously. The                            Olympus [id number]) Colonoscope was                            introduced  through the anus and advanced to the the                            terminal ileum, with identification of the                            appendiceal orifice and IC valve. The colonoscopy                            was performed without difficulty. The patient                            tolerated the procedure well. The quality of the                            bowel preparation was good. The terminal ileum,                            ileocecal valve, appendiceal orifice, and rectum                            were photographed. Scope In: 3:43:02 PM Scope Out: 4:03:02 PM Scope Withdrawal Time: 0 hours 13 minutes 57 seconds  Total Procedure Duration: 0 hours 20 minutes 0 seconds  Findings:                 The perianal and digital rectal examinations were                            normal.                           The terminal ileum appeared normal.                           Patchy mild mucosal changes characterized by                            congestion (edema), erythema, friability and  granularity were found in the sigmoid colon, in the                            proximal sigmoid colon, in the mid sigmoid colon,                            in the descending colon, in the proximal descending                            colon, in the mid descending colon and in the                            distal descending colon. Biopsies were taken with a                            cold forceps for histology. Verification of patient                            identification for the specimen was done. Estimated                            blood loss was minimal.                           Multiple small-mouthed diverticula were found in                            the sigmoid colon.                           The exam was otherwise without abnormality on                            direct and retroflexion views. Complications:            No immediate complications. Estimated Blood  Loss:     Estimated blood loss: none. Impression:               - The examined portion of the ileum was normal.                           - Patchy mild mucosal changes were found in the                            sigmoid colon, in the proximal sigmoid colon, in                            the mid sigmoid colon, in the descending colon, in                            the proximal descending colon, in the mid                            descending colon and in the distal descending  colon                            secondary to ischemic colitis. Biopsied.                           - Diverticulosis in the sigmoid colon.                           - The examination was otherwise normal on direct                            and retroflexion views. Recommendation:           - Patient has a contact number available for                            emergencies. The signs and symptoms of potential                            delayed complications were discussed with the                            patient. Return to normal activities tomorrow.                            Written discharge instructions were provided to the                            patient.                           - Await pathology results.                           - Will call results                           use dicyclomine prn and advance diet as tolerated Gatha Mayer, MD 10/27/2020 4:17:32 PM This report has been signed electronically.

## 2020-10-27 NOTE — Progress Notes (Signed)
1550 Ephedrine 10 mg given IV due to low BP, MD updated.   

## 2020-10-31 ENCOUNTER — Telehealth: Payer: Self-pay

## 2020-10-31 ENCOUNTER — Telehealth: Payer: Self-pay | Admitting: *Deleted

## 2020-10-31 NOTE — Telephone Encounter (Signed)
Attempted 2nd f/u phone call. No answer. Left message.  °

## 2020-10-31 NOTE — Telephone Encounter (Signed)
Attempted to reach patient for post-procedure f/u call. No answer. Left message that staff will make another attempt to reach her later today and for her to please not hesitate to call us if she has any questions/concerns regarding her care.

## 2020-11-10 ENCOUNTER — Other Ambulatory Visit (HOSPITAL_BASED_OUTPATIENT_CLINIC_OR_DEPARTMENT_OTHER): Payer: Self-pay

## 2020-11-15 ENCOUNTER — Encounter: Payer: Self-pay | Admitting: Family Medicine

## 2020-11-15 DIAGNOSIS — K559 Vascular disorder of intestine, unspecified: Secondary | ICD-10-CM | POA: Insufficient documentation

## 2020-11-15 HISTORY — DX: Vascular disorder of intestine, unspecified: K55.9

## 2020-11-20 ENCOUNTER — Encounter: Payer: 59 | Admitting: Plastic Surgery

## 2020-11-29 ENCOUNTER — Telehealth: Payer: Self-pay | Admitting: Internal Medicine

## 2020-11-29 NOTE — Telephone Encounter (Signed)
Patient has been scheduled to come see Dr. Carlean Purl tomorrow at 11:50

## 2020-11-29 NOTE — Telephone Encounter (Signed)
Patient called states she is still having a lot of abdominal pain and diarrhea since last Thursday seeking advise.

## 2020-11-30 ENCOUNTER — Encounter: Payer: Self-pay | Admitting: Internal Medicine

## 2020-11-30 ENCOUNTER — Ambulatory Visit (INDEPENDENT_AMBULATORY_CARE_PROVIDER_SITE_OTHER): Payer: 59 | Admitting: Internal Medicine

## 2020-11-30 ENCOUNTER — Other Ambulatory Visit (INDEPENDENT_AMBULATORY_CARE_PROVIDER_SITE_OTHER): Payer: 59

## 2020-11-30 VITALS — BP 92/62 | HR 71 | Ht 66.0 in | Wt 132.0 lb

## 2020-11-30 DIAGNOSIS — K559 Vascular disorder of intestine, unspecified: Secondary | ICD-10-CM

## 2020-11-30 LAB — CBC WITH DIFFERENTIAL/PLATELET
Basophils Absolute: 0.1 10*3/uL (ref 0.0–0.1)
Basophils Relative: 0.8 % (ref 0.0–3.0)
Eosinophils Absolute: 0.1 10*3/uL (ref 0.0–0.7)
Eosinophils Relative: 2.1 % (ref 0.0–5.0)
HCT: 40.9 % (ref 36.0–46.0)
Hemoglobin: 13.8 g/dL (ref 12.0–15.0)
Lymphocytes Relative: 32.5 % (ref 12.0–46.0)
Lymphs Abs: 2.3 10*3/uL (ref 0.7–4.0)
MCHC: 33.6 g/dL (ref 30.0–36.0)
MCV: 92 fl (ref 78.0–100.0)
Monocytes Absolute: 0.6 10*3/uL (ref 0.1–1.0)
Monocytes Relative: 7.8 % (ref 3.0–12.0)
Neutro Abs: 4 10*3/uL (ref 1.4–7.7)
Neutrophils Relative %: 56.8 % (ref 43.0–77.0)
Platelets: 315 10*3/uL (ref 150.0–400.0)
RBC: 4.45 Mil/uL (ref 3.87–5.11)
RDW: 13.1 % (ref 11.5–15.5)
WBC: 7.1 10*3/uL (ref 4.0–10.5)

## 2020-11-30 LAB — COMPREHENSIVE METABOLIC PANEL
ALT: 13 U/L (ref 0–35)
AST: 18 U/L (ref 0–37)
Albumin: 4.7 g/dL (ref 3.5–5.2)
Alkaline Phosphatase: 26 U/L — ABNORMAL LOW (ref 39–117)
BUN: 16 mg/dL (ref 6–23)
CO2: 28 mEq/L (ref 19–32)
Calcium: 9.8 mg/dL (ref 8.4–10.5)
Chloride: 105 mEq/L (ref 96–112)
Creatinine, Ser: 1 mg/dL (ref 0.40–1.20)
GFR: 69.36 mL/min (ref >60.00)
Glucose, Bld: 81 mg/dL (ref 70–99)
Potassium: 4.4 mEq/L (ref 3.5–5.1)
Sodium: 139 mEq/L (ref 135–145)
Total Bilirubin: 1.2 mg/dL (ref 0.2–1.2)
Total Protein: 7.6 g/dL (ref 6.0–8.3)

## 2020-11-30 LAB — C-REACTIVE PROTEIN: CRP: <1 mg/dL (ref 0.5–20.0)

## 2020-11-30 LAB — SEDIMENTATION RATE: Sed Rate: 5 mm/hr (ref 0–20)

## 2020-11-30 MED ORDER — HYDROCODONE-ACETAMINOPHEN 5-325 MG PO TABS: 1.0000 | ORAL_TABLET | Freq: Four times a day (QID) | ORAL | 0 refills | Status: DC | PRN

## 2020-11-30 NOTE — Patient Instructions (Signed)
You have been scheduled for a CT Angio.scan of the abdomen and pelvis at the [facility] location. [address], [place] [address].  You are scheduled on 12/04/20 at 4:30pm. You should arrive 15 minutes prior to your appointment time for registration. Please follow the written instructions below on the day of your exam:  WARNING: IF YOU ARE ALLERGIC TO IODINE/X-RAY DYE, PLEASE NOTIFY RADIOLOGY IMMEDIATELY AT [phone]! YOU WILL BE GIVEN A 13 HOUR PREMEDICATION PREP.  1) Do not eat anything after 12:30pm (4 hours prior to your test)  You may take any medications as prescribed with a small amount of water, if necessary. If you take any of the following medications: METFORMIN, GLUCOPHAGE, GLUCOVANCE, AVANDAMET, RIOMET, FORTAMET, Oval MET, JANUMET, GLUMETZA or METAGLIP, you MAY be asked to HOLD this medication 48 hours AFTER the exam.  The purpose of you drinking the oral contrast is to aid in the visualization of your intestinal tract. The contrast solution may cause some diarrhea. Depending on your individual set of symptoms, you may also receive an intravenous injection of x-ray contrast/dye. Plan on being at [facility] for 30 minutes or longer, depending on the type of exam you are having performed.  This test typically takes 30-45 minutes to complete.  If you have any questions regarding your exam or if you need to reschedule, you may call the CT department at [phone] between the hours of 8:00 am and 5:00 pm, Monday-Friday.  ________________________________________________________________________  Your provider has requested that you go to the basement level for lab work before leaving today. Press "B" on the elevator. The lab is located at the first door on the left as you exit the elevator.  Due to recent changes in healthcare laws, you may see the results of your imaging and laboratory studies on MyChart before your provider has had a chance to review them.  We understand  that in some cases there may be results that are confusing or concerning to you. Not all laboratory results come back in the same time frame and the provider may be waiting for multiple results in order to interpret others.  Please give [place] 48 hours in order for your provider to thoroughly review all the results before contacting the office for clarification of your results.   I appreciate the opportunity to care for you. Silvano Rusk, MD, [facility]

## 2020-11-30 NOTE — Progress Notes (Signed)
[name] 45 y.o. [birth year] [id number]  Assessment & Plan:   Encounter Diagnosis  Name Primary?   Ischemic colitis ([place]) Yes     Cause not clear - it is recurrent work-up as below.  I initially thought perhaps she had an episode of nonocclusive ischemic colitis.  However she has had recurrent problems and more bleeding now so we need to work it up further.  It seems unlikely that she would have some sort of vascular abnormality but we need to go there and determine that.  Further plans pending the CT angio and the labs.  I am not thinking she has inflammatory bowel disease but Crohn's can be difficult to find at times and it remains in the differential.  She has significant morbidity with the attacks I think it is reasonable to prescribe some narcotics as below.  If she gets a severe attack that is unremitting or worrisome for other reasons I have advised her to present to the emergency department.  Orders Placed This Encounter  Procedures   CT Angio Abd/Pel w/ and/or w/o   CBC with Differential/Platelet   Comprehensive metabolic panel   C-reactive protein   Sedimentation rate   Meds ordered this encounter  Medications   HYDROcodone-acetaminophen (NORCO/VICODIN) 5-325 MG tablet    Sig: Take 1 tablet by mouth every 6 (six) hours as needed for severe pain.    Dispense:  15 tablet    Refill:  0    [id number], Renee A, DO Dr. Helane Rima  Subjective:   Chief Complaint: Abdominal pain and bloody diarrhea  HPI Patient is here for follow-up of intermittent severe abdominal cramps diarrhea and bleeding.  She has a history of a diagnosis of ischemic colitis made a colonoscopy 10/27/2020.  She has had episodes of lower abdominal cramping and diarrhea x years. Hx IBS in college and responded well to dicyclomine. She has tried dicyclomine for recent events but it has not helped. She has had 2 episodes of these intense abdominal cramps and bleeding 1 week ago and 3 days ago. The  recovery time (usually 12-24 hours) has lengthened and intensity of symptoms has increased.  Sxs not related to vigorous exercise nor related to menstrual cycle  Other hx of menstrual migraines - pre-menstrual headaches. 3 days pre-period excedrin migraine helped but going on Lo-estrin has resolved these. She has scanty periods and when they occur spotting only. Next GYN appt Dr. Helane Rima comin soon.  Wt Readings from Last 3 Encounters:  11/30/20 132 lb (59.9 kg)  10/26/20 135 lb (61.2 kg)  08/09/20 136 lb (61.7 kg)    Allergies  Allergen Reactions   Morphine Other (See Comments)    Possible anaphylaxis/airway swelling causing delayed extubation   Cefaclor Hives   Current Meds  Medication Sig   aspirin-acetaminophen-caffeine (EXCEDRIN MIGRAINE) 250-250-65 MG tablet Take 2 tablets by mouth daily as needed for headache or migraine.   buPROPion (WELLBUTRIN) 75 MG tablet TAKE 1 TABLET BY MOUTH 2 TIMES DAILY   cetirizine (ZYRTEC) 10 MG tablet Take 10 mg by mouth daily.   dicyclomine (BENTYL) 20 MG tablet Take 1 tablet (20 mg total) by mouth 4 (four) times daily as needed (Abdominal cramping).   HYDROcodone-acetaminophen (NORCO/VICODIN) 5-325 MG tablet Take 1 tablet by mouth every 6 (six) hours as needed for severe pain.   Multiple Vitamin (MULTIVITAMIN WITH MINERALS) TABS tablet Take 1 tablet by mouth daily.   Norethindrone Acetate-Ethinyl Estrad-FE (LOESTRIN 24 FE) 1-20 MG-MCG(24) tablet TAKE 1 TABLET BY  MOUTH ONCE A DAY   vortioxetine HBr (TRINTELLIX) 20 MG TABS tablet TAKE 1 TABLET BY MOUTH ONCE A DAY   Past Medical History:  Diagnosis Date   Anxiety    Asthma    PMH: exercise induced as a child   Bruising 08/07/2010   Depression    Family history of adverse reaction to anesthesia    Son PONV   Gilberts syndrome    Hemorrhoids    Ischemic colitis ([place]) 11/15/2020   Colonoscopy 10/2020-biopsy-proven ischemic colitis.   Menstrual migraine    menstrual migraines   PONV (postoperative  nausea and vomiting)    POSTPARTUM DEPRESSION 09/26/2009   Qualifier: Diagnosis of  By: Inda Castle FNP, Melissa S    Umbilical hernia    Past Surgical History:  Procedure Laterality Date   ANKLE SURGERY     APPENDECTOMY     CESAREAN SECTION     x 2   GANGLION CYST EXCISION     X 2   HERNIA REPAIR     VENTRAL   INSERTION OF MESH N/A 01/07/2017   Procedure: INSERTION OF MESH;  Surgeon: Ralene Ok, MD;  Location: [facility];  Service: General;  Laterality: N/A;   INTUSSUSCEPTION REPAIR     MOUTH SURGERY     SHOULDER ARTHROSCOPY     x 2 right   TONSILLECTOMY AND ADENOIDECTOMY     TUBAL LIGATION     TYMPANOSTOMY TUBE PLACEMENT     UMBILICAL HERNIA REPAIR N/A 01/07/2017   Procedure: LAPAROSCOPIC UMBILICAL HERNIA REPAIR WITH MESH;  Surgeon: Ralene Ok, MD;  Location: [facility] OR;  Service: General;  Laterality: N/A;   WISDOM TOOTH EXTRACTION     Social History   Social History Narrative   Married 2 children 1 son 1 daughter   Son has developmental abnormalities and has required several surgeries   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 [employer]   Never tobacco no drug use less than 1 alcoholic beverage on average per day   family history includes Alzheimer's disease in her paternal grandfather; Breast cancer in her cousin and cousin; Cancer in her cousin, cousin, and maternal uncle; Colon polyps in her father; Depression in her mother; Diabetes in her paternal grandmother; Heart disease in her paternal grandmother; Hypothyroidism in her father; Leukemia in her father; Other in her brother; Pancreatic cancer in her maternal grandfather; Parkinson's disease in her mother; Polycystic kidney disease in her maternal grandmother and maternal uncle.   Review of Systems  As per HPI Objective:   Physical Exam BP 92/62   Pulse 71   Ht '5\' 6"'$  (1.676 m)   Wt 132 lb (59.9 kg)   SpO2 99%   BMI 21.31 kg/m  NAD WDWN WW Abd is soft mildly tender lower, BS + and no bruits

## 2020-12-01 ENCOUNTER — Encounter: Payer: Self-pay | Admitting: Internal Medicine

## 2020-12-04 ENCOUNTER — Other Ambulatory Visit: Payer: Self-pay

## 2020-12-04 ENCOUNTER — Other Ambulatory Visit: Payer: Self-pay | Admitting: Internal Medicine

## 2020-12-04 ENCOUNTER — Ambulatory Visit (HOSPITAL_BASED_OUTPATIENT_CLINIC_OR_DEPARTMENT_OTHER): Payer: 59

## 2020-12-04 ENCOUNTER — Ambulatory Visit (HOSPITAL_BASED_OUTPATIENT_CLINIC_OR_DEPARTMENT_OTHER)
Admission: RE | Admit: 2020-12-04 | Discharge: 2020-12-04 | Disposition: A | Discharge status: Home / Self Care | Payer: 59 | Source: Ambulatory Visit | Attending: Internal Medicine | Admitting: Internal Medicine

## 2020-12-04 DIAGNOSIS — K559 Vascular disorder of intestine, unspecified: Secondary | ICD-10-CM | POA: Insufficient documentation

## 2020-12-04 DIAGNOSIS — R197 Diarrhea, unspecified: Secondary | ICD-10-CM | POA: Diagnosis not present

## 2020-12-04 DIAGNOSIS — R109 Unspecified abdominal pain: Secondary | ICD-10-CM | POA: Diagnosis not present

## 2020-12-04 DIAGNOSIS — R1030 Lower abdominal pain, unspecified: Secondary | ICD-10-CM | POA: Diagnosis not present

## 2020-12-04 MED ORDER — IOHEXOL 350 MG/ML SOLN
75.0000 mL | Freq: Once | INTRAVENOUS | Status: AC | PRN
  Administered 2020-12-04: 75 mL via INTRAVENOUS

## 2020-12-05 ENCOUNTER — Other Ambulatory Visit: Payer: Self-pay | Admitting: Internal Medicine

## 2020-12-05 MED ORDER — GLYCOPYRROLATE 2 MG PO TABS: 2.0000 mg | ORAL_TABLET | Freq: Two times a day (BID) | ORAL | 0 refills | Status: DC | PRN

## 2021-01-02 ENCOUNTER — Other Ambulatory Visit: Payer: Self-pay | Admitting: Internal Medicine

## 2021-01-10 ENCOUNTER — Other Ambulatory Visit: Payer: Self-pay | Admitting: Internal Medicine

## 2021-01-10 DIAGNOSIS — K529 Noninfective gastroenteritis and colitis, unspecified: Secondary | ICD-10-CM

## 2021-01-10 DIAGNOSIS — R197 Diarrhea, unspecified: Secondary | ICD-10-CM

## 2021-01-11 ENCOUNTER — Other Ambulatory Visit (INDEPENDENT_AMBULATORY_CARE_PROVIDER_SITE_OTHER): Payer: 59

## 2021-01-11 DIAGNOSIS — K529 Noninfective gastroenteritis and colitis, unspecified: Secondary | ICD-10-CM

## 2021-01-11 DIAGNOSIS — R197 Diarrhea, unspecified: Secondary | ICD-10-CM | POA: Diagnosis not present

## 2021-01-11 LAB — CBC WITH DIFFERENTIAL/PLATELET
Basophils Absolute: 0.1 10*3/uL (ref 0.0–0.1)
Basophils Relative: 1 % (ref 0.0–3.0)
Eosinophils Absolute: 0.1 10*3/uL (ref 0.0–0.7)
Eosinophils Relative: 2 % (ref 0.0–5.0)
HCT: 43 % (ref 36.0–46.0)
Hemoglobin: 14.1 g/dL (ref 12.0–15.0)
Lymphocytes Relative: 27.7 % (ref 12.0–46.0)
Lymphs Abs: 2.1 10*3/uL (ref 0.7–4.0)
MCHC: 32.9 g/dL (ref 30.0–36.0)
MCV: 93.6 fl (ref 78.0–100.0)
Monocytes Absolute: 0.4 10*3/uL (ref 0.1–1.0)
Monocytes Relative: 5.9 % (ref 3.0–12.0)
Neutro Abs: 4.7 10*3/uL (ref 1.4–7.7)
Neutrophils Relative %: 63.4 % (ref 43.0–77.0)
Platelets: 309 10*3/uL (ref 150.0–400.0)
RBC: 4.6 Mil/uL (ref 3.87–5.11)
RDW: 13.2 % (ref 11.5–15.5)
WBC: 7.4 10*3/uL (ref 4.0–10.5)

## 2021-01-15 LAB — IBD EXPANDED PANEL
ACCA: 16 units (ref 0–90)
ALCA: 29 units (ref 0–60)
AMCA: 100 units (ref 0–100)
Atypical pANCA: NEGATIVE
gASCA: 15 units (ref 0–50)

## 2021-01-16 LAB — COMPLEMENT, TOTAL: Compl, Total (CH50): >60 U/mL — ABNORMAL HIGH (ref 31–60)

## 2021-01-16 LAB — ANTI-NUCLEAR AB-TITER (ANA TITER)
ANA TITER: 1:320 {titer} — ABNORMAL HIGH
ANA Titer 1: 1:1280 {titer} — ABNORMAL HIGH

## 2021-01-16 LAB — ANA: Anti Nuclear Antibody (ANA): POSITIVE — AB

## 2021-01-18 ENCOUNTER — Telehealth: Payer: Self-pay

## 2021-01-18 DIAGNOSIS — K559 Vascular disorder of intestine, unspecified: Secondary | ICD-10-CM

## 2021-01-18 DIAGNOSIS — R197 Diarrhea, unspecified: Secondary | ICD-10-CM

## 2021-01-18 NOTE — Telephone Encounter (Signed)
Colonoscopy instructions entered along with amb referral.

## 2021-01-23 ENCOUNTER — Other Ambulatory Visit: Payer: Self-pay

## 2021-01-23 ENCOUNTER — Encounter: Payer: Self-pay | Admitting: Internal Medicine

## 2021-01-23 ENCOUNTER — Ambulatory Visit (AMBULATORY_SURGERY_CENTER): Payer: 59 | Admitting: Internal Medicine

## 2021-01-23 VITALS — BP 108/57 | HR 65 | Temp 98.0°F | Resp 12 | Ht 65.0 in | Wt 132.0 lb

## 2021-01-23 DIAGNOSIS — F419 Anxiety disorder, unspecified: Secondary | ICD-10-CM | POA: Diagnosis not present

## 2021-01-23 DIAGNOSIS — K559 Vascular disorder of intestine, unspecified: Secondary | ICD-10-CM | POA: Diagnosis not present

## 2021-01-23 DIAGNOSIS — K5989 Other specified functional intestinal disorders: Secondary | ICD-10-CM | POA: Diagnosis not present

## 2021-01-23 DIAGNOSIS — F32A Depression, unspecified: Secondary | ICD-10-CM | POA: Diagnosis not present

## 2021-01-23 DIAGNOSIS — K921 Melena: Secondary | ICD-10-CM | POA: Diagnosis not present

## 2021-01-23 MED ORDER — SODIUM CHLORIDE 0.9 % IV SOLN: 500.0000 mL | INTRAVENOUS | Status: DC

## 2021-01-23 MED ORDER — MESALAMINE 1.2 G PO TBEC: 2.4000 g | DELAYED_RELEASE_TABLET | Freq: Every day | ORAL | 3 refills | Status: DC

## 2021-01-23 NOTE — Progress Notes (Signed)
Pt Drowsy. VSS. To PACU, report to RN. No anesthetic complications noted.  

## 2021-01-23 NOTE — Progress Notes (Signed)
[facility] History and Physical   Primary Care Physician:  Ma Hillock, DO   Reason for Procedure:   Colitis evaluation  Plan:    colonoscopy     HPI: [name] is a 45 y.o. female with recurrent abdominal pain and hematochezia of unclear cause   Past Medical History:  Diagnosis Date   Anxiety    Asthma    PMH: exercise induced as a child   Bruising 08/07/2010   Depression    Family history of adverse reaction to anesthesia    Son PONV   Gilberts syndrome    Hemorrhoids    Ischemic colitis ([place]) 11/15/2020   Colonoscopy 10/2020-biopsy-proven ischemic colitis.   Menstrual migraine    menstrual migraines   PONV (postoperative nausea and vomiting)    POSTPARTUM DEPRESSION 09/26/2009   Qualifier: Diagnosis of  By: Inda Castle FNP, Melissa S    Umbilical hernia     Past Surgical History:  Procedure Laterality Date   ANKLE SURGERY     APPENDECTOMY     CESAREAN SECTION     x 2   GANGLION CYST EXCISION     X 2   HERNIA REPAIR     VENTRAL   INSERTION OF MESH N/A 01/07/2017   Procedure: INSERTION OF MESH;  Surgeon: Ralene Ok, MD;  Location: [facility];  Service: General;  Laterality: N/A;   INTUSSUSCEPTION REPAIR     MOUTH SURGERY     SHOULDER ARTHROSCOPY     x 2 right   TONSILLECTOMY AND ADENOIDECTOMY     TUBAL LIGATION     TYMPANOSTOMY TUBE PLACEMENT     UMBILICAL HERNIA REPAIR N/A 01/07/2017   Procedure: LAPAROSCOPIC UMBILICAL HERNIA REPAIR WITH MESH;  Surgeon: Ralene Ok, MD;  Location: [facility];  Service: General;  Laterality: N/A;   WISDOM TOOTH EXTRACTION      Prior to Admission medications   Medication Sig Start Date End Date Taking? Authorizing Provider  aspirin-acetaminophen-caffeine (EXCEDRIN MIGRAINE) [phone] MG tablet Take 2 tablets by mouth daily as needed for headache or migraine.   Yes [provider]  buPROPion (WELLBUTRIN) 75 MG tablet TAKE 1 TABLET BY MOUTH 2 TIMES DAILY 08/09/20 08/09/21 Yes Kuneff, Renee A, DO   cetirizine (ZYRTEC) 10 MG tablet Take 10 mg by mouth daily.   Yes [provider]  Multiple Vitamin (MULTIVITAMIN WITH MINERALS) TABS tablet Take 1 tablet by mouth daily.   Yes [provider]  Norethindrone Acetate-Ethinyl Estrad-FE (LOESTRIN 24 FE) 1-20 MG-MCG(24) tablet TAKE 1 TABLET BY MOUTH ONCE A DAY 08/09/20 08/09/21 Yes Kuneff, Renee A, DO  vortioxetine HBr (TRINTELLIX) 20 MG TABS tablet TAKE 1 TABLET BY MOUTH ONCE A DAY 08/09/20 08/09/21 Yes Kuneff, Renee A, DO  glycopyrrolate (ROBINUL) 2 MG tablet TAKE 1 TABLET (2 MG TOTAL) BY MOUTH 2 (TWO) TIMES DAILY AS NEEDED (ABDOMINAL PAIN/CRAMPS). Patient not taking: Reported on 01/23/2021 01/02/21   Gatha Mayer, MD  HYDROcodone-acetaminophen (NORCO/VICODIN) 5-325 MG tablet Take 1 tablet by mouth every 6 (six) hours as needed for severe pain. 11/30/20   Gatha Mayer, MD    Current Outpatient Medications  Medication Sig Dispense Refill   aspirin-acetaminophen-caffeine (EXCEDRIN MIGRAINE) [phone] MG tablet Take 2 tablets by mouth daily as needed for headache or migraine.     buPROPion (WELLBUTRIN) 75 MG tablet TAKE 1 TABLET BY MOUTH 2 TIMES DAILY 180 tablet 3   cetirizine (ZYRTEC) 10 MG tablet Take 10 mg by mouth daily.     Multiple  Vitamin (MULTIVITAMIN WITH MINERALS) TABS tablet Take 1 tablet by mouth daily.     Norethindrone Acetate-Ethinyl Estrad-FE (LOESTRIN 24 FE) 1-20 MG-MCG(24) tablet TAKE 1 TABLET BY MOUTH ONCE A DAY 84 tablet 3   vortioxetine HBr (TRINTELLIX) 20 MG TABS tablet TAKE 1 TABLET BY MOUTH ONCE A DAY 90 tablet 3   glycopyrrolate (ROBINUL) 2 MG tablet TAKE 1 TABLET (2 MG TOTAL) BY MOUTH 2 (TWO) TIMES DAILY AS NEEDED (ABDOMINAL PAIN/CRAMPS). (Patient not taking: Reported on 01/23/2021) 60 tablet 3   HYDROcodone-acetaminophen (NORCO/VICODIN) 5-325 MG tablet Take 1 tablet by mouth every 6 (six) hours as needed for severe pain. 15 tablet 0   Current Facility-Administered Medications  Medication Dose Route  Frequency Provider Last Rate Last Admin   0.9 %  sodium chloride infusion  500 mL Intravenous Continuous Gatha Mayer, MD        Allergies as of 01/23/2021 - Review Complete 01/23/2021  Allergen Reaction Noted   Morphine Other (See Comments) 09/20/2009   Cefaclor Hives 09/20/2009    Family History  Problem Relation Age of Onset   Parkinson's disease Mother    Depression Mother    Colon polyps Father    Hypothyroidism Father    Leukemia Father    Other Brother        essential tremor   Polycystic kidney disease Maternal Uncle    Cancer Maternal Uncle        GBM   Polycystic kidney disease Maternal Grandmother    Pancreatic cancer Maternal Grandfather    Heart disease Paternal Grandmother    Diabetes Paternal Grandmother    Alzheimer's disease Paternal Grandfather    Cancer Cousin        breast   Breast cancer Cousin    Cancer Cousin        breast   Breast cancer Cousin    Colon cancer Neg Hx    Rectal cancer Neg Hx    Stomach cancer Neg Hx    Esophageal cancer Neg Hx     Social History   Socioeconomic History   Marital status: Married    Spouse name: Not on file   Number of children: 2   Years of education: Not on file   Highest education level: Not on file  Occupational History   Occupation: physician  Tobacco Use   Smoking status: Never   Smokeless tobacco: Never  Vaping Use   Vaping Use: Never used  Substance and Sexual Activity   Alcohol use: Yes    Alcohol/week: 0.0 standard drinks    Comment: less than 1 per day   Drug use: No   Sexual activity: Yes  Other Topics Concern   Not on file  Social History Narrative   Married 2 children 1 son 1 daughter   Son has developmental abnormalities and has required several surgeries   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  Physical Exam: Vital signs BP 108/72   Pulse 75   Temp 98 F (36.7 C) (  Temporal)   Resp 11   Ht 5\' 5"  (1.651 m)   Wt 132 lb (59.9 kg)   SpO2 97%   BMI 21.97 kg/m   General:   Alert,  Well-developed, well-nourished, pleasant and cooperative in NAD Lungs:  Clear throughout to auscultation.   Heart:  Regular rate and rhythm; no murmurs, clicks, rubs,  or gallops. Abdomen:  Soft, nontender and nondistended. Normal bowel sounds.   Neuro/Psych:  Alert and cooperative. Normal mood and affect. A and O x 3   @Hilery Wintle  Simonne Maffucci, MD, [facility] [phone] (pager) 01/23/2021 1:34 PM@

## 2021-01-23 NOTE — Progress Notes (Signed)
Called to room to assist during endoscopic procedure.  Patient ID and intended procedure confirmed with present staff. Received instructions for my participation in the procedure from the performing physician.  

## 2021-01-23 NOTE — Op Note (Addendum)
[place] Patient Name: [name] Procedure Date: 01/23/2021 1:05 PM MRN: [id number] Endoscopist: Gatha Mayer , MD Age: 45 Referring MD:  Date of Birth: 02/06/1976 Gender: Female Account #: [id number] Procedure:                Colonoscopy Indications:              Abdominal pain in the left lower quadrant,                            Hematochezia Medicines:                Propofol per Anesthesia, Monitored Anesthesia Care Procedure:                Pre-Anesthesia Assessment:                           - Prior to the procedure, a History and Physical                            was performed, and patient medications and                            allergies were reviewed. The patient's tolerance of                            previous anesthesia was also reviewed. The risks                            and benefits of the procedure and the sedation                            options and risks were discussed with the patient.                            All questions were answered, and informed consent                            was obtained. Prior Anticoagulants: The patient has                            taken no previous anticoagulant or antiplatelet                            agents. ASA Grade Assessment: II - A patient with                            mild systemic disease. After reviewing the risks                            and benefits, the patient was deemed in                            satisfactory condition to undergo the procedure.  After obtaining informed consent, the colonoscope                            was passed under direct vision. Throughout the                            procedure, the patient's blood pressure, pulse, and                            oxygen saturations were monitored continuously. The                            Olympus PCF-H190DL (#[id number]) Colonoscope was                            introduced through the anus  and advanced to the the                            terminal ileum, with identification of the                            appendiceal orifice and IC valve. The colonoscopy                            was performed without difficulty. The patient                            tolerated the procedure well. The quality of the                            bowel preparation was good. The terminal ileum,                            ileocecal valve, appendiceal orifice, and rectum                            were photographed. The bowel preparation used was                            Miralax via split dose instruction. Scope In: 1:38:11 PM Scope Out: 2:00:12 PM Scope Withdrawal Time: 0 hours 16 minutes 3 seconds  Total Procedure Duration: 0 hours 22 minutes 1 second  Findings:                 The perianal and digital rectal examinations were                            normal.                           The terminal ileum contained a single localized                            non-bleeding erosion. Biopsies were taken with a  cold forceps for histology. Verification of patient                            identification for the specimen was done. Estimated                            blood loss was minimal.                           Diffuse inflammation characterized by congestion                            (edema), erythema and aphthous ulcerations was                            found in the sigmoid colon and in the descending                            colon. Biopsies were taken with a cold forceps for                            histology. Verification of patient identification                            for the specimen was done. Estimated blood loss was                            minimal.                           A diminutive scar was found in the sigmoid colon.                            Biopsies were taken with a cold forceps for                            histology.  Verification of patient identification                            for the specimen was done. Estimated blood loss was                            minimal.                           The exam was otherwise without abnormality on                            direct and retroflexion views.                           Biopsies for histology were taken with a cold                            forceps from the ascending  colon and transverse                            colon for evaluation of microscopic colitis.                            Estimated blood loss was minimal. Complications:            No immediate complications. Estimated Blood Loss:     Estimated blood loss was minimal. Impression:               - A single erosion in the terminal ileum. Biopsied.                           - Diffuse inflammation was found in the sigmoid                            colon and in the descending colon. Biopsied.Worse                            than 10/2020 exam that showed changes of ischemia on                            biopsy                           - Scar in the sigmoid colon. Biopsied.                           - The examination was otherwise normal on direct                            and retroflexion views. Previously seen diverticula                            not identified on today's exam.                           - Biopsies were taken with a cold forceps from the                            ascending colon and transverse colon for evaluation                            of microscopic colitis. Recommendation:           - Patient has a contact number available for                            emergencies. The signs and symptoms of potential                            delayed complications were discussed with the                            patient. Return to normal  activities tomorrow.                            Written discharge instructions were provided to the                            patient.                            - Resume previous diet.                           - Continue present medications.                           - Await pathology results. have started mesalamine                            2.4 g daily - working dx Crohn's Gatha Mayer, MD 01/23/2021 2:19:31 PM This report has been signed electronically.

## 2021-01-23 NOTE — Patient Instructions (Addendum)
There was more left colon erythema and what I think were aphthous ulcers. Also two areas that look like scars. There was one area of erosion in the terminal ileum (this can be seen in people without disease).  I biopsied all the abnormal areas + normal colon.  I will let you know.  I appreciate the opportunity to care for you. Gatha Mayer, MD, FACG   YOU HAD AN ENDOSCOPIC PROCEDURE TODAY AT [facility]:   Refer to the procedure report that was given to you for any specific questions about what was found during the examination.  If the procedure report does not answer your questions, please call your gastroenterologist to clarify.  If you requested that your care partner not be given the details of your procedure findings, then the procedure report has been included in a sealed envelope for you to review at your convenience later.  YOU SHOULD EXPECT: Some feelings of bloating in the abdomen. Passage of more gas than usual.  Walking can help get rid of the air that was put into your GI tract during the procedure and reduce the bloating. If you had a lower endoscopy (such as a colonoscopy or flexible sigmoidoscopy) you may notice spotting of blood in your stool or on the toilet paper. If you underwent a bowel prep for your procedure, you may not have a normal bowel movement for a few days.  Please Note:  You might notice some irritation and congestion in your nose or some drainage.  This is from the oxygen used during your procedure.  There is no need for concern and it should clear up in a day or so.  SYMPTOMS TO REPORT IMMEDIATELY:  Following lower endoscopy (colonoscopy or flexible sigmoidoscopy):  Excessive amounts of blood in the stool  Significant tenderness or worsening of abdominal pains  Swelling of the abdomen that is new, acute  Fever of 100F or higher   For urgent or emergent issues, a gastroenterologist can be reached at any hour by calling [phone]  [phone]. Do not use MyChart messaging for urgent concerns.    DIET:  We do recommend a small meal at first, but then you may proceed to your regular diet.  Drink plenty of fluids but you should avoid alcoholic beverages for 24 hours.  ACTIVITY:  You should plan to take it easy for the rest of today and you should NOT DRIVE or use heavy machinery until tomorrow (because of the sedation medicines used during the test).    FOLLOW UP: Our staff will call the number listed on your records 48-72 hours following your procedure to check on you and address any questions or concerns that you may have regarding the information given to you following your procedure. If we do not reach you, we will leave a message.  We will attempt to reach you two times.  During this call, we will ask if you have developed any symptoms of COVID 19. If you develop any symptoms (ie: fever, flu-like symptoms, shortness of breath, cough etc.) before then, please call [phone].  If you test positive for Covid 19 in the 2 weeks post procedure, please call and report this information to [place].    If any biopsies were taken you will be contacted by phone or by letter within the next 1-3 weeks.  Please call us at [phone] if you have not heard about the biopsies in 3 weeks.    SIGNATURES/CONFIDENTIALITY: You and/or your care partner  have signed paperwork which will be entered into your electronic medical record.  These signatures attest to the fact that that the information above on your After Visit Summary has been reviewed and is understood.  Full responsibility of the confidentiality of this discharge information lies with you and/or your care-partner.

## 2021-01-23 NOTE — Progress Notes (Signed)
Pt's states no medical or surgical changes since previsit or office visit. 

## 2021-01-25 ENCOUNTER — Telehealth: Payer: Self-pay | Admitting: Family Medicine

## 2021-01-25 ENCOUNTER — Telehealth: Payer: Self-pay

## 2021-01-25 DIAGNOSIS — R768 Other specified abnormal immunological findings in serum: Secondary | ICD-10-CM

## 2021-01-25 DIAGNOSIS — K559 Vascular disorder of intestine, unspecified: Secondary | ICD-10-CM

## 2021-01-25 NOTE — Telephone Encounter (Signed)
Urgent rheumatology referral placed for patient today to Dr. Vernelle Emerald concerning ANA titer greater than 1200 with recurrent ischemic colitis.

## 2021-01-25 NOTE — Telephone Encounter (Signed)
Attempted to reach pt. With follow-up call following endoscopic procedure 01/23/2021.  LM on pt. Voice mail to call if she has any questions or concerns.

## 2021-01-25 NOTE — Telephone Encounter (Signed)
Attempted f/u call. No answer, left VM. 

## 2021-01-26 ENCOUNTER — Other Ambulatory Visit (HOSPITAL_BASED_OUTPATIENT_CLINIC_OR_DEPARTMENT_OTHER): Payer: Self-pay

## 2021-02-09 ENCOUNTER — Other Ambulatory Visit (HOSPITAL_BASED_OUTPATIENT_CLINIC_OR_DEPARTMENT_OTHER): Payer: Self-pay

## 2021-02-13 ENCOUNTER — Other Ambulatory Visit: Payer: Self-pay

## 2021-02-13 ENCOUNTER — Other Ambulatory Visit (HOSPITAL_BASED_OUTPATIENT_CLINIC_OR_DEPARTMENT_OTHER): Payer: Self-pay

## 2021-02-13 MED ORDER — MESALAMINE 1.2 G PO TBEC
2.4000 g | DELAYED_RELEASE_TABLET | Freq: Every day | ORAL | 3 refills | Status: DC
  Filled 2021-02-13 – 2021-02-20 (×2): qty 180, 90d supply, fill #0
  Filled 2021-05-16: qty 180, 90d supply, fill #1
  Filled 2021-06-20: qty 60, 30d supply, fill #1

## 2021-02-13 NOTE — Telephone Encounter (Signed)
Lialda refill sent to [facility] per patient request for 90 day supply as insurance requested. Patient informed.

## 2021-02-20 ENCOUNTER — Other Ambulatory Visit (HOSPITAL_BASED_OUTPATIENT_CLINIC_OR_DEPARTMENT_OTHER): Payer: Self-pay

## 2021-02-26 ENCOUNTER — Telehealth: Payer: Self-pay | Admitting: Physician Assistant

## 2021-02-26 DIAGNOSIS — U071 COVID-19: Secondary | ICD-10-CM

## 2021-02-26 MED ORDER — MOLNUPIRAVIR EUA 200MG CAPSULE: 4.0000 | ORAL_CAPSULE | Freq: Two times a day (BID) | ORAL | 0 refills | Status: AC

## 2021-02-26 NOTE — Telephone Encounter (Signed)
Patient testing positive for COVID last night with onset of fever, aches, significant fatigue, headache and milder URI symptoms. Denies chest pain or SOB. Is candidate for molnupiravir. Pros/Cons of antiviral treatment discussed and she would like to proceed with treatment. She is lower personal risk of complications but helps to take care of father and mother both with substantial medical issues. Rx molnupiravir sent in to preferred pharmacy.

## 2021-03-07 NOTE — Progress Notes (Signed)
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 female here for evaluation of positive ANA with recurrent episodes of abdominal pain and bloody diarrhea. Medical history is significant for hypermobility with multiple joint fractures and repeated shoulder dislocations and syncope. . She has had chronic IBS symptoms since adolescence but since about 1.5 years ago episodes of cramping and abdominal pain have been coming and going. These have progressively worsened with more severe and longer lasting symptoms and for the past several months started passing substantial blood afterwards. She has had a significant workup with GI clinic including labs showing positive ANA, borderline AMCA, elevated total complement, and abdominal CT and CT angiography imaging that was unremarkable. Colonoscopy in July pathology indicated for ischemic colitis, and due to recurrent episodes repeat study in October was performed showing increasing extent of inflammation to inspection and non diagnostic pathology. She started on lialda for working diagnosis of crohns disease now about 6 weeks into treatment she continues having abdominal pain symptoms although no new bloody stools. There were no recent preceding medical events before symptoms onset. She has been under considerable life stressors with work and family with a father diagnosed with cancer since 2 years ago and her son requiring medical procedures. She has experienced generalized hair thinning without skin rashes or discoloration. She has had painful ulceration on gingival surfaces. Dry eyes and mouth somewhat chronically not on prescription drug treatment  no known uveitis no retinal, corneal, or lens detachments. She has some chest pain or discomfort centered around her sternum with deep inspiration and direct pressure. She had pregnancy complications with both children but no history of miscarriage and no blood clots. She had umbilical hernia repair in 2018 without severe complications.  Labs reviewed 01/2021 ANA 1:1280 homogenous 1:320 speckled IBD panel AMCA 100 (0-100) CBC wnl Complement CH50 >60  11/2020 ESR 5 CRP <1 CBC wnl CMP unremarkable  Imaging reviewed 12/04/20 CT Angio Abd/Pel IMPRESSION: VASCULAR Patent mesenteric vasculature.  No stenosis. NON-VASCULAR No acute abnormality of the abdomen or pelvis. Large colonic stool burden.  10/26/20 CT Abd/Pel w/ Contrast IMPRESSION: No acute intra-abdominal pathology identified. No definite radiographic explanation for the patient's reported symptoms.  Activities of Daily Living:  Patient reports morning stiffness for 30 minutes.   Patient Reports nocturnal pain.  Difficulty dressing/grooming: Denies Difficulty climbing stairs: Denies Difficulty getting out of chair: Denies Difficulty using hands for taps, buttons, cutlery, and/or writing: Denies  Review of Systems  Constitutional:  Positive for fatigue.  HENT:  Positive for mouth dryness.   Eyes:  Positive for dryness.  Respiratory:  Negative for shortness of breath.   Cardiovascular:  Negative for swelling in legs/feet.  Gastrointestinal:  Positive for constipation and diarrhea.  Endocrine: Positive for heat intolerance.  Genitourinary:  Negative for difficulty urinating.  Musculoskeletal:  Positive for joint pain, joint pain and morning stiffness.  Skin:  Negative for rash.  Allergic/Immunologic: Negative for susceptible to infections.  Neurological:  Negative for numbness.  Hematological:  Negative for bruising/bleeding tendency.  Psychiatric/Behavioral:  Positive for  sleep disturbance.    PMFS History:  Patient  Active Problem List   Diagnosis Date Noted   ANA positive 01/25/2021   Ischemic colitis ([place]) 11/15/2020   Dysmenorrhea 08/09/2020   Encounter for birth control pills maintenance 08/09/2020   Depressed mood 02/11/2017   MIGRAINE HEADACHE 05/08/2010    Past Medical History:  Diagnosis Date   Anxiety    Asthma    PMH: exercise induced as a child   Bruising 08/07/2010   Depression    Family history of adverse reaction to anesthesia    Son PONV   Gilberts syndrome    Hemorrhoids    Ischemic colitis ([place]) 11/15/2020   Colonoscopy 10/2020-biopsy-proven ischemic colitis.   Menstrual migraine    menstrual migraines   PONV (postoperative nausea and vomiting)    POSTPARTUM DEPRESSION 09/26/2009   Qualifier: Diagnosis of  By: Inda Castle FNP, Melissa S    Umbilical hernia     Family History  Problem Relation Age of Onset   Parkinson's disease Mother    Depression Mother    Colon polyps Father    Hypothyroidism Father    Leukemia Father    Other Brother        essential tremor   Hypertension Brother    Polycystic kidney disease Maternal Uncle    Cancer Maternal Uncle        GBM   Polycystic kidney disease Maternal Grandmother    Pancreatic cancer Maternal Grandfather    Heart disease Paternal Grandmother    Diabetes Paternal Grandmother    Alzheimer's disease Paternal Grandfather    Cancer Cousin        breast   Breast cancer Cousin    Cancer Cousin        breast   Breast cancer Cousin    Colon cancer Neg Hx    Rectal cancer Neg Hx    Stomach cancer Neg Hx    Esophageal cancer Neg Hx    Past Surgical History:  Procedure Laterality Date   ANKLE SURGERY     APPENDECTOMY     CESAREAN SECTION     x 2   GANGLION CYST EXCISION     X 2   HERNIA REPAIR     VENTRAL   INSERTION OF MESH N/A 01/07/2017   Procedure: INSERTION OF MESH;  Surgeon: Ralene Ok, MD;  Location: [facility];  Service: General;  Laterality: N/A;   INTUSSUSCEPTION REPAIR     MOUTH SURGERY     SHOULDER  ARTHROSCOPY     x 2 right   TONSILLECTOMY AND ADENOIDECTOMY     TUBAL LIGATION     TYMPANOSTOMY TUBE PLACEMENT     UMBILICAL HERNIA REPAIR N/A 01/07/2017   Procedure: LAPAROSCOPIC UMBILICAL HERNIA REPAIR WITH MESH;  Surgeon: Ralene Ok, MD;  Location: [facility];  Service: General;  Laterality: N/A;   WISDOM TOOTH EXTRACTION     Social History   Social History Narrative   Married 2 children 1 son 1 daughter   Son has developmental abnormalities and has required several surgeries   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 [employer]   Never tobacco no drug use less than 1 alcoholic beverage on average per day   Immunization History  Administered Date(s) Administered   Influenza Split 01/03/2012, 01/17/2020   Influenza,inj,Quad PF,6+ Mos 01/20/2014, 12/28/2014, 12/29/2015, 12/13/2016, 12/30/2017   PFIZER(Purple Top)SARS-COV-2 Vaccination 04/07/2019, 04/26/2019, 01/07/2020   Tdap 11/06/2013     Objective: Vital Signs: BP 90/61 (BP Location: Right Arm, Patient Position: Sitting, Cuff  Size: Normal)   Pulse 77   Resp 14   Ht '5\' 6"'  (1.676 m)   Wt 135 lb (61.2 kg)   BMI 21.79 kg/m    Physical Exam HENT:     Right Ear: External ear normal.     Left Ear: External ear normal.     Mouth/Throat:     Mouth: Mucous membranes are moist.     Pharynx: Oropharynx is clear.  Eyes:     Conjunctiva/sclera: Conjunctivae normal.  Cardiovascular:     Rate and Rhythm: Normal rate and regular rhythm.  Pulmonary:     Effort: Pulmonary effort is normal.     Breath sounds: Normal breath sounds.  Musculoskeletal:     Right lower leg: No edema.     Left lower leg: No edema.  Skin:    General: Skin is warm and dry.     Findings: No rash.     Comments: Normal nailfold capillaroscopy b/l Mild skin hyperextensibility  Neurological:     General: No focal deficit present.     Mental Status: She is alert.     Comments: 3+ reflexes bilaterally  Psychiatric:        Mood and Affect: Mood normal.      Musculoskeletal Exam:  Neck full ROM no tenderness Shoulders full ROM right shoulder pain with resisted abduction Elbows hyperextensible no tenderness or swelling Wrists full ROM, mild left wrist tenderness, no palpable swelling Fingers full ROM no tenderness or swelling No paraspinal tenderness to palpation over upper and lower back Hip normal internal and external rotation without pain, mild posterior right sided pain with FABER maneuver Knees full ROM no tenderness or swelling Ankles slightly increased ROM no tenderness or swelling Normal longitudinal and transverse arch, normal MTPs   Investigation: No additional findings.  Imaging: No results found.  Recent Labs: Lab Results  Component Value Date   WBC 7.4 01/11/2021   HGB 14.1 01/11/2021   PLT 309.0 01/11/2021   NA 139 11/30/2020   K 4.4 11/30/2020   CL 105 11/30/2020   CO2 28 11/30/2020   GLUCOSE 81 11/30/2020   BUN 16 11/30/2020   CREATININE 1.00 11/30/2020   BILITOT 1.2 11/30/2020   ALKPHOS 26 (L) 11/30/2020   AST 18 11/30/2020   ALT 13 11/30/2020   PROT 7.6 11/30/2020   ALBUMIN 4.7 11/30/2020   CALCIUM 9.8 11/30/2020   GFRAA >60 01/11/2017    Speciality Comments: No specialty comments available.  Procedures:  No procedures performed Allergies: Morphine and Cefaclor   Assessment / Plan:     Visit Diagnoses: ANA positive - Plan: Anti-scleroderma antibody, RNP Antibody, Anti-Smith antibody, Sjogrens syndrome-A extractable nuclear antibody, Sjogrens syndrome-B extractable nuclear antibody, Anti-DNA antibody, double-stranded, C3 and C4, IgG, IgA, IgM  Positive ANA at high titer clinical history and exam is mostly nonspecific for evidence of SLE or other ANA related disease process. Enteritis or mesenteric vasculitis can certainly present activity without systemic flare but unusual as an initial presentation in isolation. Strong family history of polycystic kidney disease but none personally and fairly  recent metabolic panel and UA clear no repeat needed. Will check specific autoantibodies as listed and also checking complement C3 and C4 and serum immunoglobulins for other evidence of systemic disease.   Ischemic colitis [facility])  Working diagnosis possible crohn's disease currently on lialda possibly some benefit so far. Ischemic injury on histology can be consistent with systemic CTD but is nonspecific. Also may not be the primary mechanism of injury  with nothing present on second exam histology despite worse visible disease.  Orders: Orders Placed This Encounter  Procedures   Anti-scleroderma antibody   RNP Antibody   Anti-Smith antibody   Sjogrens syndrome-A extractable nuclear antibody   Sjogrens syndrome-B extractable nuclear antibody   Anti-DNA antibody, double-stranded   C3 and C4   IgG, IgA, IgM    No orders of the defined types were placed in this encounter.    Follow-Up Instructions: No follow-ups on file.   Collier Salina, MD  Note - This record has been created using [employer].  Chart creation errors have been sought, but may not always  have been located. Such creation errors do not reflect on  the standard of medical care.

## 2021-03-08 ENCOUNTER — Other Ambulatory Visit: Payer: Self-pay

## 2021-03-08 ENCOUNTER — Encounter: Payer: Self-pay | Admitting: Internal Medicine

## 2021-03-08 ENCOUNTER — Ambulatory Visit (INDEPENDENT_AMBULATORY_CARE_PROVIDER_SITE_OTHER): Payer: 59 | Admitting: Internal Medicine

## 2021-03-08 VITALS — BP 90/61 | HR 77 | Resp 14 | Ht 66.0 in | Wt 135.0 lb

## 2021-03-08 DIAGNOSIS — R768 Other specified abnormal immunological findings in serum: Secondary | ICD-10-CM | POA: Diagnosis not present

## 2021-03-08 DIAGNOSIS — K559 Vascular disorder of intestine, unspecified: Secondary | ICD-10-CM

## 2021-03-09 LAB — ANTI-DNA ANTIBODY, DOUBLE-STRANDED: ds DNA Ab: 2 IU/mL

## 2021-03-09 LAB — ANTI-SMITH ANTIBODY: ENA SM Ab Ser-aCnc: <1 AI

## 2021-03-09 LAB — IGG, IGA, IGM
IgG (Immunoglobin G), Serum: 1066 mg/dL (ref 600–1640)
IgM, Serum: 115 mg/dL (ref 50–300)
Immunoglobulin A: 83 mg/dL (ref 47–310)

## 2021-03-09 LAB — SJOGRENS SYNDROME-A EXTRACTABLE NUCLEAR ANTIBODY: SSA (Ro) (ENA) Antibody, IgG: <1 AI

## 2021-03-09 LAB — C3 AND C4
C3 Complement: 159 mg/dL (ref 83–193)
C4 Complement: 25 mg/dL (ref 15–57)

## 2021-03-09 LAB — RNP ANTIBODY: Ribonucleic Protein(ENA) Antibody, IgG: <1 AI

## 2021-03-09 LAB — ANTI-SCLERODERMA ANTIBODY: Scleroderma (Scl-70) (ENA) Antibody, IgG: <1 AI

## 2021-03-09 LAB — SJOGRENS SYNDROME-B EXTRACTABLE NUCLEAR ANTIBODY: SSB (La) (ENA) Antibody, IgG: <1 AI

## 2021-03-19 ENCOUNTER — Encounter: Payer: Self-pay | Admitting: Internal Medicine

## 2021-03-19 NOTE — Progress Notes (Signed)
Mychart message sent regarding findings. Her antibody tests are all negative, combined with atypical symptoms I don't recommend any additional treatment for lupus or other systemic autoimmune disease at this time. I think we can follow up just as needed if there is a change or new symptoms.

## 2021-04-09 ENCOUNTER — Encounter: Payer: Self-pay | Admitting: Internal Medicine

## 2021-04-10 ENCOUNTER — Other Ambulatory Visit: Payer: Self-pay | Admitting: Internal Medicine

## 2021-04-10 MED ORDER — BUDESONIDE ER 9 MG PO TB24: 9.0000 mg | ORAL_TABLET | Freq: Every day | ORAL | 0 refills | Status: DC

## 2021-04-11 ENCOUNTER — Telehealth: Payer: Self-pay

## 2021-04-11 ENCOUNTER — Other Ambulatory Visit: Payer: Self-pay | Admitting: Internal Medicine

## 2021-04-11 ENCOUNTER — Encounter: Payer: Self-pay | Admitting: Internal Medicine

## 2021-04-11 MED ORDER — BUDESONIDE 3 MG PO CPEP: 9.0000 mg | ORAL_CAPSULE | Freq: Every day | ORAL | 0 refills | Status: DC

## 2021-04-11 NOTE — Telephone Encounter (Signed)
Patient informed via Deloris Ping that this has been done.

## 2021-04-11 NOTE — Telephone Encounter (Signed)
Patient found the budesonide 3mg  at [facility] in [place] for a good price and she requested I send it there. Per Dr Carlean Purl that is okay, she will take 3 pills daily.

## 2021-04-20 ENCOUNTER — Other Ambulatory Visit (HOSPITAL_BASED_OUTPATIENT_CLINIC_OR_DEPARTMENT_OTHER): Payer: Self-pay

## 2021-05-08 ENCOUNTER — Encounter: Payer: Self-pay | Admitting: Family Medicine

## 2021-05-09 ENCOUNTER — Telehealth: Payer: Self-pay | Admitting: Family Medicine

## 2021-05-09 DIAGNOSIS — K529 Noninfective gastroenteritis and colitis, unspecified: Secondary | ICD-10-CM

## 2021-05-09 DIAGNOSIS — K559 Vascular disorder of intestine, unspecified: Secondary | ICD-10-CM

## 2021-05-09 DIAGNOSIS — R197 Diarrhea, unspecified: Secondary | ICD-10-CM

## 2021-05-09 DIAGNOSIS — R768 Other specified abnormal immunological findings in serum: Secondary | ICD-10-CM

## 2021-05-09 NOTE — Telephone Encounter (Signed)
Referral placed.

## 2021-05-09 NOTE — Telephone Encounter (Signed)
Please advise if ok ?

## 2021-05-16 ENCOUNTER — Other Ambulatory Visit (HOSPITAL_BASED_OUTPATIENT_CLINIC_OR_DEPARTMENT_OTHER): Payer: Self-pay

## 2021-05-16 ENCOUNTER — Other Ambulatory Visit: Payer: Self-pay

## 2021-05-18 ENCOUNTER — Other Ambulatory Visit (HOSPITAL_BASED_OUTPATIENT_CLINIC_OR_DEPARTMENT_OTHER): Payer: Self-pay

## 2021-05-31 ENCOUNTER — Other Ambulatory Visit: Payer: Self-pay

## 2021-05-31 ENCOUNTER — Ambulatory Visit
Admission: RE | Admit: 2021-05-31 | Discharge: 2021-05-31 | Disposition: A | Discharge status: Home / Self Care | Payer: 59 | Source: Ambulatory Visit | Attending: Family Medicine | Admitting: Family Medicine

## 2021-05-31 DIAGNOSIS — Z1231 Encounter for screening mammogram for malignant neoplasm of breast: Secondary | ICD-10-CM | POA: Diagnosis not present

## 2021-06-05 DIAGNOSIS — H524 Presbyopia: Secondary | ICD-10-CM | POA: Diagnosis not present

## 2021-06-20 ENCOUNTER — Other Ambulatory Visit (HOSPITAL_BASED_OUTPATIENT_CLINIC_OR_DEPARTMENT_OTHER): Payer: Self-pay

## 2021-06-25 DIAGNOSIS — K921 Melena: Secondary | ICD-10-CM | POA: Diagnosis not present

## 2021-06-25 DIAGNOSIS — K5900 Constipation, unspecified: Secondary | ICD-10-CM | POA: Diagnosis not present

## 2021-06-25 DIAGNOSIS — R1084 Generalized abdominal pain: Secondary | ICD-10-CM | POA: Diagnosis not present

## 2021-07-02 ENCOUNTER — Other Ambulatory Visit (HOSPITAL_BASED_OUTPATIENT_CLINIC_OR_DEPARTMENT_OTHER): Payer: Self-pay

## 2021-07-11 ENCOUNTER — Encounter: Payer: Self-pay | Admitting: Family Medicine

## 2021-07-11 MED ORDER — SCOPOLAMINE 1 MG/3DAYS TD PT72
1.0000 | MEDICATED_PATCH | TRANSDERMAL | 0 refills | Status: DC
Start: 2021-07-11 — End: 2021-08-14

## 2021-07-11 NOTE — Telephone Encounter (Signed)
Please advise 

## 2021-07-12 ENCOUNTER — Other Ambulatory Visit (HOSPITAL_BASED_OUTPATIENT_CLINIC_OR_DEPARTMENT_OTHER): Payer: Self-pay

## 2021-07-16 DIAGNOSIS — K625 Hemorrhage of anus and rectum: Secondary | ICD-10-CM

## 2021-07-16 DIAGNOSIS — R1084 Generalized abdominal pain: Secondary | ICD-10-CM | POA: Insufficient documentation

## 2021-07-16 HISTORY — DX: Hemorrhage of anus and rectum: K62.5

## 2021-08-14 ENCOUNTER — Other Ambulatory Visit (HOSPITAL_BASED_OUTPATIENT_CLINIC_OR_DEPARTMENT_OTHER): Payer: Self-pay

## 2021-08-14 ENCOUNTER — Other Ambulatory Visit: Payer: Self-pay | Admitting: Family Medicine

## 2021-08-14 DIAGNOSIS — R4589 Other symptoms and signs involving emotional state: Secondary | ICD-10-CM

## 2021-08-14 DIAGNOSIS — N946 Dysmenorrhea, unspecified: Secondary | ICD-10-CM

## 2021-08-14 MED ORDER — BUPROPION HCL 75 MG PO TABS
ORAL_TABLET | Freq: Two times a day (BID) | ORAL | 0 refills | Status: DC
  Filled 2021-08-14: qty 180, 90d supply, fill #0

## 2021-08-14 MED ORDER — VORTIOXETINE HBR 20 MG PO TABS
ORAL_TABLET | Freq: Every day | ORAL | 0 refills | Status: DC
  Filled 2021-08-14: qty 90, 90d supply, fill #0

## 2021-08-14 MED ORDER — BLISOVI 24 FE 1-20 MG-MCG(24) PO TABS
1.0000 | ORAL_TABLET | Freq: Every day | ORAL | 3 refills | Status: DC
  Filled 2021-08-14: qty 84, 84d supply, fill #0

## 2021-08-14 NOTE — Telephone Encounter (Signed)
Refilled prescriptions for 90 days.  Patient has an upcoming appointment on May 15.  We will provide the rest of her refills that day once we touch base to ensure doses are adequate. ?

## 2021-08-14 NOTE — Telephone Encounter (Signed)
Please advise on changes.  

## 2021-08-20 ENCOUNTER — Other Ambulatory Visit (HOSPITAL_BASED_OUTPATIENT_CLINIC_OR_DEPARTMENT_OTHER): Payer: Self-pay

## 2021-08-20 ENCOUNTER — Telehealth (INDEPENDENT_AMBULATORY_CARE_PROVIDER_SITE_OTHER): Payer: 59 | Admitting: Family Medicine

## 2021-08-20 ENCOUNTER — Other Ambulatory Visit: Payer: 59

## 2021-08-20 ENCOUNTER — Encounter: Payer: Self-pay | Admitting: Family Medicine

## 2021-08-20 VITALS — HR 81 | Wt 140.0 lb

## 2021-08-20 DIAGNOSIS — K559 Vascular disorder of intestine, unspecified: Secondary | ICD-10-CM | POA: Diagnosis not present

## 2021-08-20 DIAGNOSIS — R768 Other specified abnormal immunological findings in serum: Secondary | ICD-10-CM | POA: Diagnosis not present

## 2021-08-20 DIAGNOSIS — K5900 Constipation, unspecified: Secondary | ICD-10-CM

## 2021-08-20 DIAGNOSIS — R1084 Generalized abdominal pain: Secondary | ICD-10-CM | POA: Diagnosis not present

## 2021-08-20 DIAGNOSIS — R4589 Other symptoms and signs involving emotional state: Secondary | ICD-10-CM | POA: Diagnosis not present

## 2021-08-20 DIAGNOSIS — K625 Hemorrhage of anus and rectum: Secondary | ICD-10-CM | POA: Diagnosis not present

## 2021-08-20 DIAGNOSIS — N946 Dysmenorrhea, unspecified: Secondary | ICD-10-CM

## 2021-08-20 MED ORDER — BUPROPION HCL 75 MG PO TABS
ORAL_TABLET | Freq: Two times a day (BID) | ORAL | 3 refills | Status: DC
  Filled 2021-08-20 – 2021-11-12 (×2): qty 180, 90d supply, fill #0
  Filled 2022-02-14: qty 180, 90d supply, fill #1
  Filled 2022-05-30: qty 180, 90d supply, fill #2

## 2021-08-20 MED ORDER — VORTIOXETINE HBR 20 MG PO TABS
ORAL_TABLET | Freq: Every day | ORAL | 3 refills | Status: DC
  Filled 2021-08-20 – 2021-11-12 (×2): qty 90, 90d supply, fill #0
  Filled 2022-02-14: qty 90, 90d supply, fill #1
  Filled 2022-05-30: qty 90, 90d supply, fill #2

## 2021-08-20 NOTE — Progress Notes (Signed)
? ? ? ?VIRTUAL VISIT VIA VIDEO ? ?I connected with [name] on 08/21/21 at  3:40 PM EDT by a video enabled telemedicine application and verified that I am speaking with the correct person using two identifiers. ?Location patient: Home ?Location provider: [facility], Office ?Persons participating in the virtual visit: Patient, Dr. Raoul Pitch and Cyndra Numbers, [place] ? ?I discussed the limitations of evaluation and management by telemedicine and the availability of in person appointments. The patient expressed understanding and agreed to proceed. ? ? ? ? ?Patient ID: [name], female  DOB: [birth year], 46 y.o.   MRN: [id number] ?Patient Care Team  ?  Relationship Specialty Notifications Start End  ?Ma Hillock, DO PCP - General Family Medicine  08/09/20   ?Dian Queen, MD Consulting Physician Obstetrics and Gynecology  09/04/17   ? ? ?Chief Complaint  ?Patient presents with  ? Irritable Bowel Syndrome  ? ? ?Subjective: ?[name] is a 46 y.o.  Female  present for [facility] ?All past medical history, surgical history, allergies, family history, immunizations, medications and social history were updated in the electronic medical record today. ?All recent labs, ED visits and hospitalizations within the last year were reviewed. ? ?Dysmenorrhea/Encounter for birth control pills maintenance ?Patient started back on birth control pills 2021  for dysmenorrhea.  She has noticed hot flashes, mostly at night.  Her mother started perimenopause/menopausal symptoms earlier in life/40s as well.  She would like to continue with the birth control pills at this time.  Her Pap is up-to-date.  She follows with gynecology for cervical cancer screenings. ? ?Depressed mood ?She reports she has been prescribed Trintellix 20 mg daily and Wellbutrin 75 mg twice daily for quite some time.  This regimen has working well for her and she would like to continue.   ? ?Constipation/IBS:  ?Patient reports IBS-C symptoms.  She  will take a stool softener or use MiraLAX to help with bowel movements.  These provide very temporary relief and then she will go back to having constipation.  She reports she will have a bowel movement about every 5 days.  She has had a positive ANA with rather significant titers of 1: 1260 with rectal bleeding and signs of ischemic colitis.  She has followed with gastroenterology [facility] and rheumatology. ? ? ? ?  08/09/2020  ?  3:28 PM 08/25/2018  ? 11:36 AM 03/04/2018  ?  4:30 PM 09/04/2017  ?  9:44 AM  ?Depression screen PHQ 2/9  ?Decreased Interest 0 0 0 1  ?Down, Depressed, Hopeless 0 0 0 1  ?PHQ - 2 Score 0 0 0 2  ?Altered sleeping  0 0 3  ?Tired, decreased energy  0 0 2  ?Change in appetite  0 0 0  ?Feeling bad or failure about yourself   0 0 0  ?Trouble concentrating  0 0 1  ?Moving slowly or fidgety/restless  0 0 0  ?Suicidal thoughts  0 0 0  ?PHQ-9 Score  0 0 8  ?Difficult doing work/chores  Not difficult at all Not difficult at all Somewhat difficult  ? ?   ? View : No data to display.  ?  ?  ?  ? ?  ?  ? ?Immunization History  ?Administered Date(s) Administered  ? Influenza Split 01/03/2012, 01/17/2020  ? Influenza,inj,Quad PF,6+ Mos 01/20/2014, 12/28/2014, 12/29/2015, 12/13/2016, 12/30/2017  ? PFIZER(Purple Top)SARS-COV-2 Vaccination 04/07/2019, 04/26/2019, 01/07/2020  ? Tdap 11/06/2013  ? ? ?Past Medical  History:  ?Diagnosis Date  ? Anxiety   ? Asthma   ? PMH: exercise induced as a child  ? Bruising 08/07/2010  ? Depression   ? Family history of adverse reaction to anesthesia   ? Son PONV  ? Gilberts syndrome   ? Hemorrhoids   ? Ischemic colitis ([place]) 11/15/2020  ? Colonoscopy 10/2020-biopsy-proven ischemic colitis.  ? Menstrual migraine   ? menstrual migraines  ? PONV (postoperative nausea and vomiting)   ? POSTPARTUM DEPRESSION 09/26/2009  ? Qualifier: Diagnosis of  By: Inda Castle FNP, [place]   ? Umbilical hernia   ? ?Allergies  ?Allergen Reactions  ? Morphine Other (See Comments)  ?   Possible anaphylaxis/airway swelling causing delayed extubation  ? Cefaclor Hives  ? ?Past Surgical History:  ?Procedure Laterality Date  ? ANKLE SURGERY    ? APPENDECTOMY    ? CESAREAN SECTION    ? x 2  ? GANGLION CYST EXCISION    ? X 2  ? HERNIA REPAIR    ? VENTRAL  ? INSERTION OF MESH N/A 01/07/2017  ? Procedure: INSERTION OF MESH;  Surgeon: Ralene Ok, MD;  Location: [place];  Service: General;  Laterality: N/A;  ? INTUSSUSCEPTION REPAIR    ? MOUTH SURGERY    ? SHOULDER ARTHROSCOPY    ? x 2 right  ? TONSILLECTOMY AND ADENOIDECTOMY    ? TUBAL LIGATION    ? TYMPANOSTOMY TUBE PLACEMENT    ? UMBILICAL HERNIA REPAIR N/A 01/07/2017  ? Procedure: LAPAROSCOPIC UMBILICAL HERNIA REPAIR WITH MESH;  Surgeon: Ralene Ok, MD;  Location: [place];  Service: General;  Laterality: N/A;  ? WISDOM TOOTH EXTRACTION    ? ?Family History  ?Problem Relation Age of Onset  ? Parkinson's disease Mother   ? Depression Mother   ? Colon polyps Father   ? Hypothyroidism Father   ? Leukemia Father   ? Other Brother   ?     essential tremor  ? Hypertension Brother   ? Polycystic kidney disease Maternal Uncle   ? Cancer Maternal Uncle   ?     GBM  ? Polycystic kidney disease Maternal Grandmother   ? Pancreatic cancer Maternal Grandfather   ? Heart disease Paternal Grandmother   ? Diabetes Paternal Grandmother   ? Alzheimer's disease Paternal Grandfather   ? Cancer Cousin   ?     breast  ? Breast cancer Cousin   ? Cancer Cousin   ?     breast  ? Breast cancer Cousin   ? Colon cancer Neg Hx   ? Rectal cancer Neg Hx   ? Stomach cancer Neg Hx   ? Esophageal cancer Neg Hx   ? ?Social History  ? ?Social History Narrative  ? Married 2 children 1 son 1 daughter  ? Son has developmental abnormalities and has required several surgeries  ? Employment: [employer]  ? Never tobacco no drug use less than 1 alcoholic beverage on average per day  ? ? ?Allergies as of 08/20/2021   ? ?   Reactions  ? Morphine Other (See Comments)  ?  Possible anaphylaxis/airway swelling causing delayed extubation  ? Cefaclor Hives  ? ?  ? ?  ?Medication List  ?  ? ?  ? Accurate as of Aug 20, 2021 11:59 PM. If you have any questions, ask your nurse or doctor.  ?  ?  ? ?  ? ?aspirin-acetaminophen-caffeine 250-250-65 MG tablet ?Commonly known as: Bandera ?Take  2 tablets by mouth daily as needed for headache or migraine. ?  ?Blisovi 24 Fe 1-20 MG-MCG(24) tablet ?Generic drug: Norethindrone Acetate-Ethinyl Estrad-FE ?TAKE 1 TABLET BY MOUTH ONCE A DAY ?  ?buPROPion 75 MG tablet ?Commonly known as: WELLBUTRIN ?Take 1 tablet by mouth two times daily. ?(TAKE 1 TABLET BY MOUTH 2 TIMES DAILY) ?  ?cetirizine 10 MG tablet ?Commonly known as: ZYRTEC ?Take 10 mg by mouth daily. ?  ?lubiprostone 8 MCG capsule ?Commonly known as: AMITIZA ?Take 1 capsule (8 mcg total) by mouth 2 (two) times daily with a meal. ?Started by: Howard Pouch, DO ?  ?multivitamin with minerals Tabs tablet ?Take 1 tablet by mouth daily. ?  ?vortioxetine HBr 20 MG Tabs tablet ?Commonly known as: Trintellix ?TAKE 1 TABLET BY MOUTH ONCE A DAY ?  ? ?  ? ? ?All past medical history, surgical history, allergies, family history, immunizations andmedications were updated in the EMR today and reviewed under the history and medication portions of their EMR.    ? ?Recent Results (from the past 2160 hour(s))  ?T4, free     Status: None  ? Collection Time: 08/20/21  4:08 PM  ?Result Value Ref Range  ? Free T4 1.0 0.8 - 1.8 ng/dL  ?TSH     Status: None  ? Collection Time: 08/20/21  4:08 PM  ?Result Value Ref Range  ? TSH 0.70 mIU/L  ?  Comment:           Reference Range ?. ?          > or = 20 Years  0.40-4.50 ?. ?               Pregnancy Ranges ?          First trimester    0.26-2.66 ?          Second trimester   0.55-2.73 ?          Third trimester    0.43-2.91 ?  ?T3, free     Status: None  ? Collection Time: 08/20/21  4:08 PM  ?Result Value Ref Range  ? T3, Free 2.9 2.3 - 4.2 pg/mL  ? ? ? ? ?ROS: 14 pt  review of systems performed and negative (unless mentioned in an HPI) ? ?Objective: ?Pulse 81   Wt 140 lb (63.5 kg)   LMP  (LMP Unknown)   SpO2 99%   BMI 22.60 kg/m?  ?Gen: Afebrile. No acute distress.  ?HEN

## 2021-08-21 ENCOUNTER — Encounter (HOSPITAL_BASED_OUTPATIENT_CLINIC_OR_DEPARTMENT_OTHER): Payer: Self-pay | Admitting: Pharmacist

## 2021-08-21 ENCOUNTER — Encounter: Payer: Self-pay | Admitting: Family Medicine

## 2021-08-21 ENCOUNTER — Other Ambulatory Visit (HOSPITAL_BASED_OUTPATIENT_CLINIC_OR_DEPARTMENT_OTHER): Payer: Self-pay

## 2021-08-21 MED ORDER — BLISOVI 24 FE 1-20 MG-MCG(24) PO TABS
1.0000 | ORAL_TABLET | Freq: Every day | ORAL | 3 refills | Status: DC
  Filled 2021-08-21 – 2021-10-05 (×2): qty 84, 84d supply, fill #0
  Filled 2021-12-28: qty 84, 84d supply, fill #1
  Filled 2022-03-20: qty 84, 84d supply, fill #2
  Filled 2022-05-30: qty 84, 84d supply, fill #3

## 2021-08-21 MED ORDER — LUBIPROSTONE 8 MCG PO CAPS
8.0000 ug | ORAL_CAPSULE | Freq: Two times a day (BID) | ORAL | 1 refills | Status: DC
  Filled 2021-08-21: qty 180, 90d supply, fill #0

## 2021-08-21 NOTE — Patient Instructions (Signed)
Return in about 24 weeks (around 02/04/2022) for cpe (40 min). ? ? ? ? ? ? ? ?Great to see you today.  ?I have refilled the medication(s) we provide.  ? ?If labs were collected, we will inform you of lab results once received either by echart message or telephone call.  ? - echart message- for normal results that have been seen by the patient already.  ? - telephone call: abnormal results or if patient has not viewed results in their echart. ? ?

## 2021-08-22 ENCOUNTER — Other Ambulatory Visit (HOSPITAL_BASED_OUTPATIENT_CLINIC_OR_DEPARTMENT_OTHER): Payer: Self-pay

## 2021-08-23 ENCOUNTER — Encounter: Payer: Self-pay | Admitting: Family Medicine

## 2021-08-23 ENCOUNTER — Telehealth: Payer: Self-pay | Admitting: Family Medicine

## 2021-08-23 DIAGNOSIS — R768 Other specified abnormal immunological findings in serum: Secondary | ICD-10-CM

## 2021-08-23 DIAGNOSIS — K559 Vascular disorder of intestine, unspecified: Secondary | ICD-10-CM

## 2021-08-23 LAB — T3, FREE: T3, Free: 2.9 pg/mL (ref 2.3–4.2)

## 2021-08-23 LAB — ANA, IFA COMPREHENSIVE PANEL
Anti Nuclear Antibody (ANA): POSITIVE — AB
ENA SM Ab Ser-aCnc: <1 AI
SM/RNP: <1 AI
SSA (Ro) (ENA) Antibody, IgG: <1 AI
SSB (La) (ENA) Antibody, IgG: <1 AI
Scleroderma (Scl-70) (ENA) Antibody, IgG: <1 AI
ds DNA Ab: 2 IU/mL

## 2021-08-23 LAB — COMPLEMENT, TOTAL: Compl, Total (CH50): >60 U/mL — ABNORMAL HIGH (ref 31–60)

## 2021-08-23 LAB — T4, FREE: Free T4: 1 ng/dL (ref 0.8–1.8)

## 2021-08-23 LAB — ANTI-NUCLEAR AB-TITER (ANA TITER): ANA Titer 1: 1:1280 {titer} — ABNORMAL HIGH

## 2021-08-23 LAB — TSH: TSH: 0.7 mIU/L

## 2021-08-23 NOTE — Telephone Encounter (Signed)
Referral placed to rheumatology Dr. Trudie Reed for positive high titer ANA 1: 4825 nuclear homogenous pattern with presentation of ischemic colitis.

## 2021-09-26 DIAGNOSIS — K559 Vascular disorder of intestine, unspecified: Secondary | ICD-10-CM | POA: Diagnosis not present

## 2021-09-26 DIAGNOSIS — R768 Other specified abnormal immunological findings in serum: Secondary | ICD-10-CM | POA: Diagnosis not present

## 2021-09-26 DIAGNOSIS — Q796 Ehlers-Danlos syndrome, unspecified: Secondary | ICD-10-CM | POA: Diagnosis not present

## 2021-09-26 DIAGNOSIS — N912 Amenorrhea, unspecified: Secondary | ICD-10-CM | POA: Diagnosis not present

## 2021-09-26 DIAGNOSIS — Z6822 Body mass index (BMI) 22.0-22.9, adult: Secondary | ICD-10-CM | POA: Diagnosis not present

## 2021-09-26 DIAGNOSIS — R5383 Other fatigue: Secondary | ICD-10-CM | POA: Diagnosis not present

## 2021-09-26 DIAGNOSIS — L659 Nonscarring hair loss, unspecified: Secondary | ICD-10-CM | POA: Diagnosis not present

## 2021-10-05 ENCOUNTER — Other Ambulatory Visit (HOSPITAL_BASED_OUTPATIENT_CLINIC_OR_DEPARTMENT_OTHER): Payer: Self-pay

## 2021-10-14 ENCOUNTER — Encounter: Payer: Self-pay | Admitting: Family Medicine

## 2021-10-15 NOTE — Telephone Encounter (Signed)
Please see appropriate chart.

## 2021-11-12 ENCOUNTER — Other Ambulatory Visit (HOSPITAL_BASED_OUTPATIENT_CLINIC_OR_DEPARTMENT_OTHER): Payer: Self-pay

## 2021-11-27 ENCOUNTER — Ambulatory Visit (INDEPENDENT_AMBULATORY_CARE_PROVIDER_SITE_OTHER): Payer: 59 | Admitting: Family Medicine

## 2021-11-27 ENCOUNTER — Encounter: Payer: Self-pay | Admitting: Family Medicine

## 2021-11-27 VITALS — BP 98/61 | HR 76 | Temp 98.3°F | Ht 65.5 in | Wt 141.0 lb

## 2021-11-27 DIAGNOSIS — K559 Vascular disorder of intestine, unspecified: Secondary | ICD-10-CM | POA: Diagnosis not present

## 2021-11-27 DIAGNOSIS — Z0000 Encounter for general adult medical examination without abnormal findings: Secondary | ICD-10-CM | POA: Diagnosis not present

## 2021-11-27 DIAGNOSIS — Z1159 Encounter for screening for other viral diseases: Secondary | ICD-10-CM

## 2021-11-27 DIAGNOSIS — Z793 Long term (current) use of hormonal contraceptives: Secondary | ICD-10-CM | POA: Diagnosis not present

## 2021-11-27 DIAGNOSIS — N926 Irregular menstruation, unspecified: Secondary | ICD-10-CM

## 2021-11-27 LAB — HEMOGLOBIN A1C: Hgb A1c MFr Bld: 5.6 % (ref 4.6–6.5)

## 2021-11-27 LAB — CBC
HCT: 38.1 % (ref 36.0–46.0)
Hemoglobin: 13 g/dL (ref 12.0–15.0)
MCHC: 34.1 g/dL (ref 30.0–36.0)
MCV: 94.8 fl (ref 78.0–100.0)
Platelets: 287 10*3/uL (ref 150.0–400.0)
RBC: 4.02 Mil/uL (ref 3.87–5.11)
RDW: 13.1 % (ref 11.5–15.5)
WBC: 6.1 10*3/uL (ref 4.0–10.5)

## 2021-11-27 LAB — VITAMIN D 25 HYDROXY (VIT D DEFICIENCY, FRACTURES): VITD: 61.88 ng/mL (ref 30.00–100.00)

## 2021-11-27 LAB — TSH: TSH: 0.41 u[IU]/mL (ref 0.35–5.50)

## 2021-11-27 NOTE — Progress Notes (Unsigned)
Patient ID: MARYLOU WAGES, female  DOB: 06/12/1975, 46 y.o.   MRN: [id number] Patient Care Team    Relationship Specialty Notifications Start End  Ma Hillock, DO PCP - General Family Medicine  08/09/20   Dian Queen, MD Consulting Physician Obstetrics and Gynecology  09/04/17   Otelia Sergeant, OD Referring Physician   11/27/21   Alfonse Ras May, MD Referring Physician   11/27/21   Gatha Mayer, MD Consulting Physician Gastroenterology  11/27/21     Chief Complaint  Patient presents with   Annual Exam    Pt is not fasting    Subjective: DORCUS RIGA is a 46 y.o.  Female  present for CPE  All past medical history, surgical history, allergies, family history, immunizations, medications and social history were updated in the electronic medical record today. All recent labs, ED visits and hospitalizations within the last year were reviewed.  Health maintenance:  Colonoscopy: No fhx. 01/23/2021 Mammogram: completed:05/31/2021, [facility]> ordered Cervical cancer screening: last pap: 2020> Dr. Helane Rima Immunizations: tdap UTD 2015, Influenza will get through work (encouraged yearly),Covid x3 Infectious disease screening: HIV completed during pregnancy.  Hep C agreed to screen DEXA:routine screen Assistive device: none Oxygen BOF:BPZW Patient has a Dental home. Hospitalizations/ED visits: reviewed      11/27/2021    1:42 PM 08/09/2020    3:28 PM 08/25/2018   11:36 AM 03/04/2018    4:30 PM 09/04/2017    9:44 AM  Depression screen PHQ 2/9  Decreased Interest 0 0 0 0 1  Down, Depressed, Hopeless 0 0 0 0 1  PHQ - 2 Score 0 0 0 0 2  Altered sleeping 3  0 0 3  Tired, decreased energy 2  0 0 2  Change in appetite 0  0 0 0  Feeling bad or failure about yourself  0  0 0 0  Trouble concentrating 0  0 0 1  Moving slowly or fidgety/restless 0  0 0 0  Suicidal thoughts 0  0 0 0  PHQ-9 Score 5  0 0 8  Difficult doing work/chores   Not difficult at all Not difficult at all  Somewhat difficult      11/27/2021    1:52 PM  GAD 7 : Generalized Anxiety Score  Nervous, Anxious, on Edge 1  Control/stop worrying 1  Worry too much - different things 1  Trouble relaxing 1  Restless 1  Easily annoyed or irritable 1  Afraid - awful might happen 0  Total GAD 7 Score 6     Immunization History  Administered Date(s) Administered   Influenza Split 01/03/2012, 01/17/2020   Influenza,inj,Quad PF,6+ Mos 01/20/2014, 12/28/2014, 12/29/2015, 12/13/2016, 12/30/2017   PFIZER(Purple Top)SARS-COV-2 Vaccination 04/07/2019, 04/26/2019, 01/07/2020   Tdap 11/06/2013    Past Medical History:  Diagnosis Date   Anxiety    Asthma    PMH: exercise induced as a child   Bruising 08/07/2010   Depression    EDS (Ehlers-Danlos syndrome)    Family history of adverse reaction to anesthesia    Son PONV   Gilberts syndrome    Hemorrhoids    Ischemic colitis ([place]) 11/15/2020   Colonoscopy 10/2020-biopsy-proven ischemic colitis.   Menstrual migraine    menstrual migraines   PONV (postoperative nausea and vomiting)    POSTPARTUM DEPRESSION 09/26/2009   Qualifier: Diagnosis of  By: Inda Castle FNP, Melissa S    Umbilical hernia    Allergies  Allergen Reactions   Morphine Other (See Comments)  Possible anaphylaxis/airway swelling causing delayed extubation   Cefaclor Hives   Past Surgical History:  Procedure Laterality Date   ANKLE SURGERY     APPENDECTOMY     CESAREAN SECTION     x 2   GANGLION CYST EXCISION     X 2   HERNIA REPAIR     VENTRAL   INSERTION OF MESH N/A 01/07/2017   Procedure: INSERTION OF MESH;  Surgeon: Ralene Ok, MD;  Location: [place];  Service: General;  Laterality: N/A;   INTUSSUSCEPTION REPAIR     MOUTH SURGERY     SHOULDER ARTHROSCOPY     x 2 right   TONSILLECTOMY AND ADENOIDECTOMY     TUBAL LIGATION     TYMPANOSTOMY TUBE PLACEMENT     UMBILICAL HERNIA REPAIR N/A 01/07/2017   Procedure: LAPAROSCOPIC UMBILICAL HERNIA REPAIR WITH MESH;   Surgeon: Ralene Ok, MD;  Location: [facility] OR;  Service: General;  Laterality: N/A;   WISDOM TOOTH EXTRACTION     Family History  Problem Relation Age of Onset   Parkinson's disease Mother    Depression Mother    Colon polyps Father    Hypothyroidism Father    Leukemia Father    Other Brother        essential tremor   Hypertension Brother    Polycystic kidney disease Maternal Uncle    Cancer Maternal Uncle        GBM   Polycystic kidney disease Maternal Grandmother    Pancreatic cancer Maternal Grandfather    Heart disease Paternal Grandmother    Diabetes Paternal Grandmother    Alzheimer's disease Paternal Grandfather    Cancer Cousin        breast   Breast cancer Cousin    Cancer Cousin        breast   Breast cancer Cousin    Colon cancer Neg Hx    Rectal cancer Neg Hx    Stomach cancer Neg Hx    Esophageal cancer Neg Hx    Social History   Social History Narrative   Married 2 children 1 son 1 daughter   Son has developmental abnormalities and has required several surgeries   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   Never tobacco no drug use less than 1 alcoholic beverage on average per day    Allergies as of 11/27/2021       Reactions   Morphine Other (See Comments)   Possible anaphylaxis/airway swelling causing delayed extubation   Cefaclor Hives        Medication List        Accurate as of November 27, 2021  2:14 PM. If you have any questions, ask your nurse or doctor.          STOP taking these medications    lubiprostone 8 MCG capsule Commonly known as: AMITIZA Stopped by: Howard Pouch, DO       TAKE these medications    aspirin-acetaminophen-caffeine 250-250-65 MG tablet Commonly known as: EXCEDRIN MIGRAINE Take 2 tablets by mouth daily as needed for headache or migraine.   Blisovi 24 Fe 1-20 MG-MCG(24) tablet Generic drug: Norethindrone Acetate-Ethinyl Estrad-FE TAKE 1 TABLET BY MOUTH ONCE A DAY   buPROPion 75 MG  tablet Commonly known as: WELLBUTRIN Take 1 tablet by mouth two times daily. (TAKE 1 TABLET BY MOUTH 2 TIMES DAILY)   cetirizine 10 MG tablet Commonly known as: ZYRTEC Take 10 mg by mouth daily.   multivitamin with minerals Tabs tablet Take 1 tablet  by mouth daily.   Trintellix 20 MG Tabs tablet Generic drug: vortioxetine HBr TAKE 1 TABLET BY MOUTH ONCE A DAY   Vitamin D 50 MCG (2000 UT) Caps Take by mouth.        All past medical history, surgical history, allergies, family history, immunizations andmedications were updated in the EMR today and reviewed under the history and medication portions of their EMR.     No results found for this or any previous visit (from the past 2160 hour(s)).  MM 3D SCREEN BREAST BILATERAL  Result Date: 06/01/2021 CLINICAL DATA:  Screening. EXAM: DIGITAL SCREENING BILATERAL MAMMOGRAM WITH TOMOSYNTHESIS AND CAD TECHNIQUE: Bilateral screening digital craniocaudal and mediolateral oblique mammograms were obtained. Bilateral screening digital breast tomosynthesis was performed. The images were evaluated with computer-aided detection. COMPARISON:  Previous exam(s). ACR Breast Density Category c: The breast tissue is heterogeneously dense, which may obscure small masses. FINDINGS: There are no findings suspicious for malignancy. IMPRESSION: No mammographic evidence of malignancy. A result letter of this screening mammogram will be mailed directly to the patient. RECOMMENDATION: Screening mammogram in one year. (Code:SM-B-01Y) BI-RADS CATEGORY  1: Negative. Electronically Signed   By: Dorise Bullion III M.D.   On: 06/01/2021 18:11     ROS 14 pt review of systems performed and negative (unless mentioned in an HPI)  Objective: BP 98/61   Pulse 76   Temp 98.3 F (36.8 C) (Oral)   Ht 5' 5.5" (1.664 m)   Wt 141 lb (64 kg)   SpO2 98%   BMI 23.11 kg/m  Physical Exam Vitals and nursing note reviewed.  Constitutional:      General: She is not in acute  distress.    Appearance: Normal appearance. She is not ill-appearing or toxic-appearing.  HENT:     Head: Normocephalic and atraumatic.     Right Ear: Tympanic membrane, ear canal and external ear normal. There is no impacted cerumen.     Left Ear: Tympanic membrane, ear canal and external ear normal. There is no impacted cerumen.     Nose: No congestion or rhinorrhea.     Mouth/Throat:     Mouth: Mucous membranes are moist.     Pharynx: Oropharynx is clear. No oropharyngeal exudate or posterior oropharyngeal erythema.  Eyes:     General: No scleral icterus.       Right eye: No discharge.        Left eye: No discharge.     Extraocular Movements: Extraocular movements intact.     Conjunctiva/sclera: Conjunctivae normal.     Pupils: Pupils are equal, round, and reactive to light.  Cardiovascular:     Rate and Rhythm: Normal rate and regular rhythm.     Pulses: Normal pulses.     Heart sounds: Normal heart sounds. No murmur heard.    No friction rub. No gallop.  Pulmonary:     Effort: Pulmonary effort is normal. No respiratory distress.     Breath sounds: Normal breath sounds. No stridor. No wheezing, rhonchi or rales.  Chest:     Chest wall: No tenderness.  Abdominal:     General: Abdomen is flat. Bowel sounds are normal. There is no distension.     Palpations: Abdomen is soft. There is no mass.     Tenderness: There is no abdominal tenderness. There is no right CVA tenderness, left CVA tenderness, guarding or rebound.     Hernia: No hernia is present.  Musculoskeletal:        General:  No swelling, tenderness or deformity. Normal range of motion.     Cervical back: Normal range of motion and neck supple. No rigidity or tenderness.     Right lower leg: No edema.     Left lower leg: No edema.  Lymphadenopathy:     Cervical: No cervical adenopathy.  Skin:    General: Skin is warm and dry.     Coloration: Skin is not jaundiced or pale.     Findings: No bruising, erythema, lesion  or rash.  Neurological:     General: No focal deficit present.     Mental Status: She is alert and oriented to person, place, and time. Mental status is at baseline.     Cranial Nerves: No cranial nerve deficit.     Sensory: No sensory deficit.     Motor: No weakness.     Coordination: Coordination normal.     Gait: Gait normal.     Deep Tendon Reflexes: Reflexes normal.  Psychiatric:        Mood and Affect: Mood normal.        Behavior: Behavior normal.        Thought Content: Thought content normal.        Judgment: Judgment normal.      No results found.  Assessment/plan: SAMADHI MAHURIN is a 46 y.o. female present for CPE    Patient was encouraged to exercise greater than 150 minutes a week. Patient was encouraged to choose a diet filled with fresh fruits and vegetables, and lean meats. AVS provided to patient today for education/recommendation on gender specific health and safety maintenance. No follow-ups on file.  Orders Placed This Encounter  Procedures   CBC   Comprehensive metabolic panel   Hemoglobin A1c   Lipid panel   Vitamin D (25 hydroxy)   Hepatitis C Antibody   TSH    Orders Placed This Encounter  Procedures   CBC   Comprehensive metabolic panel   Hemoglobin A1c   Lipid panel   Vitamin D (25 hydroxy)   Hepatitis C Antibody   TSH   No orders of the defined types were placed in this encounter.  Referral Orders  No referral(s) requested today     Electronically signed by: Howard Pouch, [place]

## 2021-11-27 NOTE — Patient Instructions (Signed)
No follow-ups on file.        Great to see you today.  I have refilled the medication(s) we provide.   If labs were collected, we will inform you of lab results once received either by echart message or telephone call.   - echart message- for normal results that have been seen by the patient already.   - telephone call: abnormal results or if patient has not viewed results in their echart.  Health Maintenance, Female Adopting a healthy lifestyle and getting preventive care are important in promoting health and wellness. Ask your health care provider about: The right schedule for you to have regular tests and exams. Things you can do on your own to prevent diseases and keep yourself healthy. What should I know about diet, weight, and exercise? Eat a healthy diet  Eat a diet that includes plenty of vegetables, fruits, low-fat dairy products, and lean protein. Do not eat a lot of foods that are high in solid fats, added sugars, or sodium. Maintain a healthy weight Body mass index (BMI) is used to identify weight problems. It estimates body fat based on height and weight. Your health care provider can help determine your BMI and help you achieve or maintain a healthy weight. Get regular exercise Get regular exercise. This is one of the most important things you can do for your health. Most adults should: Exercise for at least 150 minutes each week. The exercise should increase your heart rate and make you sweat (moderate-intensity exercise). Do strengthening exercises at least twice a week. This is in addition to the moderate-intensity exercise. Spend less time sitting. Even light physical activity can be beneficial. Watch cholesterol and blood lipids Have your blood tested for lipids and cholesterol at 44 years of age, then have this test every 5 years. Have your cholesterol levels checked more often if: Your lipid or cholesterol levels are high. You are older than 44 years of  age. You are at high risk for heart disease. What should I know about cancer screening? Depending on your health history and family history, you may need to have cancer screening at various ages. This may include screening for: Breast cancer. Cervical cancer. Colorectal cancer. Skin cancer. Lung cancer. What should I know about heart disease, diabetes, and high blood pressure? Blood pressure and heart disease High blood pressure causes heart disease and increases the risk of stroke. This is more likely to develop in people who have high blood pressure readings or are overweight. Have your blood pressure checked: Every 3-5 years if you are 18-39 years of age. Every year if you are 40 years old or older. Diabetes Have regular diabetes screenings. This checks your fasting blood sugar level. Have the screening done: Once every three years after age 40 if you are at a normal weight and have a low risk for diabetes. More often and at a younger age if you are overweight or have a high risk for diabetes. What should I know about preventing infection? Hepatitis B If you have a higher risk for hepatitis B, you should be screened for this virus. Talk with your health care provider to find out if you are at risk for hepatitis B infection. Hepatitis C Testing is recommended for: Everyone born from 1945 through 1965. Anyone with known risk factors for hepatitis C. Sexually transmitted infections (STIs) Get screened for STIs, including gonorrhea and chlamydia, if: You are sexually active and are younger than 44 years of age. You are   older than 44 years of age and your health care provider tells you that you are at risk for this type of infection. Your sexual activity has changed since you were last screened, and you are at increased risk for chlamydia or gonorrhea. Ask your health care provider if you are at risk. Ask your health care provider about whether you are at high risk for HIV. Your health  care provider may recommend a prescription medicine to help prevent HIV infection. If you choose to take medicine to prevent HIV, you should first get tested for HIV. You should then be tested every 3 months for as long as you are taking the medicine. Pregnancy If you are about to stop having your period (premenopausal) and you may become pregnant, seek counseling before you get pregnant. Take 400 to 800 micrograms (mcg) of folic acid every day if you become pregnant. Ask for birth control (contraception) if you want to prevent pregnancy. Osteoporosis and menopause Osteoporosis is a disease in which the bones lose minerals and strength with aging. This can result in bone fractures. If you are 65 years old or older, or if you are at risk for osteoporosis and fractures, ask your health care provider if you should: Be screened for bone loss. Take a calcium or vitamin D supplement to lower your risk of fractures. Be given hormone replacement therapy (HRT) to treat symptoms of menopause. Follow these instructions at home: Alcohol use Do not drink alcohol if: Your health care provider tells you not to drink. You are pregnant, may be pregnant, or are planning to become pregnant. If you drink alcohol: Limit how much you have to: 0-1 drink a day. Know how much alcohol is in your drink. In the [place] one drink equals one 12 oz bottle of beer (355 mL), one 5 oz glass of wine (148 mL), or one 1 oz glass of hard liquor (44 mL). Lifestyle Do not use any products that contain nicotine or tobacco. These products include cigarettes, chewing tobacco, and vaping devices, such as e-cigarettes. If you need help quitting, ask your health care provider. Do not use street drugs. Do not share needles. Ask your health care provider for help if you need support or information about quitting drugs. General instructions Schedule regular health, dental, and eye exams. Stay current with your vaccines. Tell your health  care provider if: You often feel depressed. You have ever been abused or do not feel safe at home. Summary Adopting a healthy lifestyle and getting preventive care are important in promoting health and wellness. Follow your health care provider's instructions about healthy diet, exercising, and getting tested or screened for diseases. Follow your health care provider's instructions on monitoring your cholesterol and blood pressure. This information is not intended to replace advice given to you by your health care provider. Make sure you discuss any questions you have with your health care provider. Document Revised: 08/14/2020 Document Reviewed: 08/14/2020 Elsevier Patient Education  2023 [employer] Inc[employer]  

## 2021-11-28 LAB — HEPATITIS C ANTIBODY: Hepatitis C Ab: NONREACTIVE

## 2021-11-28 LAB — LIPID PANEL
Cholesterol: 132 mg/dL (ref 0–200)
HDL: 56.2 mg/dL (ref >39.00)
LDL Cholesterol: 57 mg/dL (ref 0–99)
NonHDL: 75.95
Total CHOL/HDL Ratio: 2
Triglycerides: 96 mg/dL (ref 0.0–149.0)
VLDL: 19.2 mg/dL (ref 0.0–40.0)

## 2021-11-28 LAB — COMPREHENSIVE METABOLIC PANEL
ALT: 12 U/L (ref 0–35)
AST: 18 U/L (ref 0–37)
Albumin: 4.5 g/dL (ref 3.5–5.2)
Alkaline Phosphatase: 31 U/L — ABNORMAL LOW (ref 39–117)
BUN: 19 mg/dL (ref 6–23)
CO2: 25 mEq/L (ref 19–32)
Calcium: 9.2 mg/dL (ref 8.4–10.5)
Chloride: 104 mEq/L (ref 96–112)
Creatinine, Ser: 0.96 mg/dL (ref 0.40–1.20)
GFR: 72.33 mL/min (ref >60.00)
Glucose, Bld: 116 mg/dL — ABNORMAL HIGH (ref 70–99)
Potassium: 4.1 mEq/L (ref 3.5–5.1)
Sodium: 141 mEq/L (ref 135–145)
Total Bilirubin: 1 mg/dL (ref 0.2–1.2)
Total Protein: 6.8 g/dL (ref 6.0–8.3)

## 2021-12-28 ENCOUNTER — Other Ambulatory Visit (HOSPITAL_BASED_OUTPATIENT_CLINIC_OR_DEPARTMENT_OTHER): Payer: Self-pay

## 2022-02-14 ENCOUNTER — Other Ambulatory Visit (HOSPITAL_BASED_OUTPATIENT_CLINIC_OR_DEPARTMENT_OTHER): Payer: Self-pay

## 2022-03-20 ENCOUNTER — Other Ambulatory Visit (HOSPITAL_BASED_OUTPATIENT_CLINIC_OR_DEPARTMENT_OTHER): Payer: Self-pay

## 2022-05-30 ENCOUNTER — Other Ambulatory Visit (HOSPITAL_BASED_OUTPATIENT_CLINIC_OR_DEPARTMENT_OTHER): Payer: Self-pay

## 2022-06-03 ENCOUNTER — Other Ambulatory Visit (HOSPITAL_BASED_OUTPATIENT_CLINIC_OR_DEPARTMENT_OTHER): Payer: Self-pay

## 2022-06-03 MED ORDER — CYCLOSPORINE 0.05 % OP EMUL
1.0000 [drp] | Freq: Two times a day (BID) | OPHTHALMIC | 3 refills | Status: AC
Start: 2022-06-03 — End: ?
  Filled 2022-06-03: qty 180, 90d supply, fill #0

## 2022-06-17 NOTE — Patient Instructions (Incomplete)
Return in about 12 weeks (around 09/10/2022) for Routine chronic condition follow-up.  Have your repeat labs completed at your office in 4 weeks.        Great to see you today.  I have refilled the medication(s) we provide.   If labs were collected, we will inform you of lab results once received either by echart message or telephone call.   - echart message- for normal results that have been seen by the patient already.   - telephone call: abnormal results or if patient has not viewed results in their echart.

## 2022-06-17 NOTE — Progress Notes (Unsigned)
[name] , 11/29/1975, 47 y.o., female MRN: [id number] Patient Care Team    Relationship Specialty Notifications Start End  Ma Hillock, DO PCP - General Family Medicine  08/09/20   Dian Queen, MD Consulting Physician Obstetrics and Gynecology  09/04/17   Otelia Sergeant, OD Referring Physician   11/27/21   Alfonse Ras May, MD Referring Physician   11/27/21   Gatha Mayer, MD Consulting Physician Gastroenterology  11/27/21     Chief Complaint  Patient presents with   Dry Eye     Subjective: MISK DOROTHY is a 47 y.o. Pt presents for an OV to discuss new diagnosis of Sjogren's disease.  Patient has noticed in the last couple of months she has had notably worsened dry eyes, dry mouth with ulcers, arthralgias (especially in her wrist) and fatigue.  She reports 1 episode of tachycardia without associated symptoms, with a heart rate of 155 with no activity. Patient also wears hearing aids, but states she has noticed that he decrease in her hearing acuity as well. She has a history of ischemic colitis of unknown etiology and depression.   She has been seen by her ophthalmologist and her rheumatologist. She is prescribed Restasis and had been using chlorhexidine mouth solution the ulcerations of the mouth. She has had a significantly high ANA with repeat confirmation of 1:1280-nuclear homogenous.  Negative reflex panel.  IgG and IgM's have been normal.  Complements normal. Vitamin D levels were normal August 2023 with a level of 61.88.  Patient has continued to supplement with vitamin D.     11/27/2021    1:42 PM 08/09/2020    3:28 PM 08/25/2018   11:36 AM 03/04/2018    4:30 PM 09/04/2017    9:44 AM  Depression screen PHQ 2/9  Decreased Interest 0 0 0 0 1  Down, Depressed, Hopeless 0 0 0 0 1  PHQ - 2 Score 0 0 0 0 2  Altered sleeping 3  0 0 3  Tired, decreased energy 2  0 0 2  Change in appetite 0  0 0 0  Feeling bad or failure about yourself  0  0 0 0   Trouble concentrating 0  0 0 1  Moving slowly or fidgety/restless 0  0 0 0  Suicidal thoughts 0  0 0 0  PHQ-9 Score 5  0 0 8  Difficult doing work/chores   Not difficult at all Not difficult at all Somewhat difficult    Allergies  Allergen Reactions   Morphine Other (See Comments)    Possible anaphylaxis/airway swelling causing delayed extubation   Cefaclor Hives   Social History   Social History Narrative   Married 2 children 1 son 1 daughter   Son has developmental abnormalities and has required several surgeries   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   Menstrual migraine    menstrual migraines  PONV (postoperative nausea and vomiting)    POSTPARTUM DEPRESSION 09/26/2009   Qualifier: Diagnosis of  By: Inda Castle FNP, Melissa S    Rectal bleeding [id number]   Umbilical hernia    Past Surgical History:  Procedure Laterality Date   ANKLE SURGERY     APPENDECTOMY     CESAREAN SECTION     x 2   GANGLION CYST EXCISION     X 2   HERNIA REPAIR     VENTRAL   INSERTION OF MESH N/A 01/07/2017   Procedure: INSERTION OF MESH;  Surgeon: Ralene Ok, MD;  Location: [facility];  Service: General;  Laterality: N/A;   INTUSSUSCEPTION REPAIR     MOUTH SURGERY     SHOULDER ARTHROSCOPY     x 2 right   TONSILLECTOMY AND ADENOIDECTOMY     TUBAL LIGATION     TYMPANOSTOMY TUBE PLACEMENT     UMBILICAL HERNIA REPAIR N/A 01/07/2017   Procedure: LAPAROSCOPIC UMBILICAL HERNIA REPAIR WITH MESH;   Surgeon: Ralene Ok, MD;  Location: [facility] OR;  Service: General;  Laterality: N/A;   WISDOM TOOTH EXTRACTION     Family History  Problem Relation Age of Onset   Parkinson's disease Mother    Depression Mother    Colon polyps Father    Hypothyroidism Father    Leukemia Father    Other Brother        essential tremor   Hypertension Brother    Polycystic kidney disease Maternal Uncle    Cancer Maternal Uncle        GBM   Polycystic kidney disease Maternal Grandmother    Pancreatic cancer Maternal Grandfather    Heart disease Paternal Grandmother    Diabetes Paternal Grandmother    Alzheimer's disease Paternal Grandfather    Cancer Cousin        breast   Breast cancer Cousin    Cancer Cousin        breast   Breast cancer Cousin    Colon cancer Neg Hx    Rectal cancer Neg Hx    Stomach cancer Neg Hx    Esophageal cancer Neg Hx    Allergies as of 06/18/2022       Reactions   Morphine Other (See Comments)   Possible anaphylaxis/airway swelling causing delayed extubation   Cefaclor Hives        Medication List        Accurate as of June 18, 2022  2:55 PM. If you have any questions, ask your nurse or doctor.          aspirin-acetaminophen-caffeine [id number] MG tablet Commonly known as: EXCEDRIN MIGRAINE Take 2 tablets by mouth daily as needed for headache or migraine.   Blisovi 24 Fe 1-20 MG-MCG(24) tablet Generic drug: Norethindrone Acetate-Ethinyl Estrad-FE TAKE 1 TABLET BY MOUTH ONCE A DAY   buPROPion 75 MG tablet Commonly known as: WELLBUTRIN Take 1 tablet by mouth two times daily. (TAKE 1 TABLET BY MOUTH 2 TIMES DAILY)   cetirizine 10 MG tablet Commonly known as: ZYRTEC Take 10 mg by mouth daily.   chlorhexidine 0.12 % solution Commonly known as: PERIDEX Use as directed 15 mLs in the mouth or throat 2 (two) times daily. Started by: Howard Pouch, DO   hydroxychloroquine 200 MG tablet Commonly known as: PLAQUENIL Take 1 tablet (200 mg total)  by mouth daily. Started by: Howard Pouch, DO   multivitamin with minerals Tabs tablet Take 1 tablet by mouth daily.   Restasis 0.05 % ophthalmic  emulsion Generic drug: cycloSPORINE Place 1 drop into both eyes every 12 (twelve) hours.   Trintellix 20 MG Tabs tablet Generic drug: vortioxetine HBr TAKE 1 TABLET BY MOUTH ONCE A DAY   Vitamin D 50 MCG (2000 UT) Caps Take by mouth.        All past medical history, surgical history, allergies, family history, immunizations andmedications were updated in the EMR today and reviewed under the history and medication portions of their EMR.     ROS Negative, with the exception of above mentioned in HPI   Objective:  BP 110/71   Pulse 75   Temp 98 F (36.7 C)   Wt 144 lb (65.3 kg)   SpO2 97%   BMI 23.60 kg/m  Body mass index is 23.6 kg/m. Physical Exam Vitals and nursing note reviewed.  Constitutional:      General: She is not in acute distress.    Appearance: Normal appearance. She is normal weight. She is not ill-appearing or toxic-appearing.  HENT:     Head: Normocephalic and atraumatic.     Mouth/Throat:     Mouth: Mucous membranes are dry.  Eyes:     General: No scleral icterus.       Right eye: No discharge.        Left eye: No discharge.     Extraocular Movements: Extraocular movements intact.     Conjunctiva/sclera: Conjunctivae normal.     Pupils: Pupils are equal, round, and reactive to light.     Comments: Dry eyes reported  Cardiovascular:     Rate and Rhythm: Normal rate and regular rhythm.     Heart sounds: No murmur heard. Pulmonary:     Effort: Pulmonary effort is normal. No respiratory distress.     Breath sounds: No wheezing, rhonchi or rales.  Musculoskeletal:        General: Tenderness present. No swelling.     Right lower leg: No edema.     Left lower leg: No edema.     Comments: No erythema present  Skin:    General: Skin is warm.     Coloration: Skin is not jaundiced.     Findings: No  lesion or rash.  Neurological:     Mental Status: She is alert and oriented to person, place, and time. Mental status is at baseline.     Motor: No weakness.     Coordination: Coordination normal.     Gait: Gait normal.  Psychiatric:        Mood and Affect: Mood normal.        Behavior: Behavior normal.        Thought Content: Thought content normal.        Judgment: Judgment normal.     No results found. No results found. No results found for this or any previous visit (from the past 24 hour(s)).  Assessment/Plan: MADDALYNN HOWALD is a 47 y.o. female present for OV for  Sjogren syndrome, unspecified ([place]) Patient would like to consider starting Plaquenil to aid in her symptoms.  Agree trial of Plaquenil would be appropriate. Start Plaquenil 200 mg daily Continue Restasis per optometry Recommend Biotene daily for mouth dryness. - CBC w/Diff; Future - Comp Met (CMET); Future Repeat labs in 4 weeks-can be completed at her office. Follow-up 12 weeks with provider  fatigue -CMP, CBC with differential, TSH, B12 collected today to rule out other etiologies of fatigue. - CBC w/Diff - TSH Continue vitamin D supplementation.  Levels were adequate.  Aphthous ulcer of mouth Continue chlorhexidine mouth solution as needed  Reviewed expectations re: course of current medical issues. Discussed self-management of symptoms. Outlined signs and symptoms indicating need for more acute intervention. Patient verbalized understanding and all questions were answered. Patient received an After-Visit Summary.    Orders Placed This Encounter  Procedures   Comp Met (CMET)   CBC w/Diff   B12   TSH   CBC w/Diff   Comp Met (CMET)   Meds ordered this encounter  Medications   hydroxychloroquine (PLAQUENIL) 200 MG tablet    Sig: Take 1 tablet (200 mg total) by mouth daily.    Dispense:  90 tablet    Refill:  1   chlorhexidine (PERIDEX) 0.12 % solution    Sig: Use as directed 15 mLs in  the mouth or throat 2 (two) times daily.    Dispense:  473 mL    Refill:  1   Referral Orders  No referral(s) requested today     Note is dictated utilizing voice recognition software. Although note has been proof read prior to signing, occasional typographical errors still can be missed. If any questions arise, please do not hesitate to call for verification.   electronically signed by:  Howard Pouch, DO  [place]

## 2022-06-18 ENCOUNTER — Ambulatory Visit (INDEPENDENT_AMBULATORY_CARE_PROVIDER_SITE_OTHER): Payer: 59 | Admitting: Family Medicine

## 2022-06-18 ENCOUNTER — Encounter: Payer: Self-pay | Admitting: Family Medicine

## 2022-06-18 ENCOUNTER — Other Ambulatory Visit (HOSPITAL_BASED_OUTPATIENT_CLINIC_OR_DEPARTMENT_OTHER): Payer: Self-pay

## 2022-06-18 VITALS — BP 110/71 | HR 75 | Temp 98.0°F | Wt 144.0 lb

## 2022-06-18 DIAGNOSIS — M3500 Sicca syndrome, unspecified: Secondary | ICD-10-CM | POA: Diagnosis not present

## 2022-06-18 DIAGNOSIS — K120 Recurrent oral aphthae: Secondary | ICD-10-CM | POA: Insufficient documentation

## 2022-06-18 DIAGNOSIS — R5383 Other fatigue: Secondary | ICD-10-CM | POA: Diagnosis not present

## 2022-06-18 HISTORY — DX: Recurrent oral aphthae: K12.0

## 2022-06-18 MED ORDER — CHLORHEXIDINE GLUCONATE 0.12 % MT SOLN
15.0000 mL | Freq: Two times a day (BID) | OROMUCOSAL | 1 refills | Status: DC
  Filled 2022-06-18: qty 473, 16d supply, fill #0

## 2022-06-18 MED ORDER — HYDROXYCHLOROQUINE SULFATE 200 MG PO TABS
200.0000 mg | ORAL_TABLET | Freq: Every day | ORAL | 1 refills | Status: DC
  Filled 2022-06-18: qty 90, 90d supply, fill #0

## 2022-06-19 LAB — COMPREHENSIVE METABOLIC PANEL
AG Ratio: 1.8 (calc) (ref 1.0–2.5)
ALT: 13 U/L (ref 6–29)
AST: 17 U/L (ref 10–30)
Albumin: 4.6 g/dL (ref 3.6–5.1)
Alkaline phosphatase (APISO): 32 U/L (ref 31–125)
BUN: 17 mg/dL (ref 7–25)
CO2: 23 mmol/L (ref 20–32)
Calcium: 9.4 mg/dL (ref 8.6–10.2)
Chloride: 105 mmol/L (ref 98–110)
Creat: 0.89 mg/dL (ref 0.50–0.99)
Globulin: 2.5 g/dL (calc) (ref 1.9–3.7)
Glucose, Bld: 97 mg/dL (ref 65–99)
Potassium: 4 mmol/L (ref 3.5–5.3)
Sodium: 141 mmol/L (ref 135–146)
Total Bilirubin: 0.7 mg/dL (ref 0.2–1.2)
Total Protein: 7.1 g/dL (ref 6.1–8.1)

## 2022-06-19 LAB — CBC WITH DIFFERENTIAL/PLATELET
Absolute Monocytes: 499 cells/uL (ref 200–950)
Basophils Absolute: 52 cells/uL (ref 0–200)
Basophils Relative: 0.5 %
Eosinophils Absolute: 114 cells/uL (ref 15–500)
Eosinophils Relative: 1.1 %
HCT: 40.1 % (ref 35.0–45.0)
Hemoglobin: 13.8 g/dL (ref 11.7–15.5)
Lymphs Abs: 2922 cells/uL (ref 850–3900)
MCH: 31.5 pg (ref 27.0–33.0)
MCHC: 34.4 g/dL (ref 32.0–36.0)
MCV: 91.6 fL (ref 80.0–100.0)
MPV: 10.8 fL (ref 7.5–12.5)
Monocytes Relative: 4.8 %
Neutro Abs: 6812 cells/uL (ref 1500–7800)
Neutrophils Relative %: 65.5 %
Platelets: 357 10*3/uL (ref 140–400)
RBC: 4.38 10*6/uL (ref 3.80–5.10)
RDW: 11.8 % (ref 11.0–15.0)
Total Lymphocyte: 28.1 %
WBC: 10.4 10*3/uL (ref 3.8–10.8)

## 2022-06-19 LAB — TSH: TSH: 0.61 mIU/L

## 2022-06-19 LAB — VITAMIN B12: Vitamin B-12: 554 pg/mL (ref 200–1100)

## 2022-07-01 ENCOUNTER — Other Ambulatory Visit: Payer: Self-pay | Admitting: Family Medicine

## 2022-07-01 DIAGNOSIS — R768 Other specified abnormal immunological findings in serum: Secondary | ICD-10-CM

## 2022-07-31 ENCOUNTER — Other Ambulatory Visit: Payer: 59

## 2022-08-01 LAB — CBC WITH DIFFERENTIAL/PLATELET
Basophils Absolute: 0 10*3/uL (ref 0.0–0.1)
Basophils Relative: 0.2 % (ref 0.0–3.0)
Eosinophils Absolute: 0.2 10*3/uL (ref 0.0–0.7)
Eosinophils Relative: 2.5 % (ref 0.0–5.0)
HCT: 41.2 % (ref 36.0–46.0)
Hemoglobin: 13.7 g/dL (ref 12.0–15.0)
Lymphocytes Relative: 43.3 % (ref 12.0–46.0)
Lymphs Abs: 3.1 10*3/uL (ref 0.7–4.0)
MCHC: 33.3 g/dL (ref 30.0–36.0)
MCV: 94.3 fl (ref 78.0–100.0)
Monocytes Absolute: 0.7 10*3/uL (ref 0.1–1.0)
Monocytes Relative: 9.6 % (ref 3.0–12.0)
Neutro Abs: 3.2 10*3/uL (ref 1.4–7.7)
Neutrophils Relative %: 44.4 % (ref 43.0–77.0)
Platelets: 329 10*3/uL (ref 150.0–400.0)
RBC: 4.37 Mil/uL (ref 3.87–5.11)
RDW: 12.9 % (ref 11.5–15.5)
WBC: 7.1 10*3/uL (ref 4.0–10.5)

## 2022-08-01 LAB — COMPREHENSIVE METABOLIC PANEL
ALT: 14 U/L (ref 0–35)
AST: 23 U/L (ref 0–37)
Albumin: 4.6 g/dL (ref 3.5–5.2)
Alkaline Phosphatase: 28 U/L — ABNORMAL LOW (ref 39–117)
BUN: 19 mg/dL (ref 6–23)
CO2: 27 mEq/L (ref 19–32)
Calcium: 9.1 mg/dL (ref 8.4–10.5)
Chloride: 106 mEq/L (ref 96–112)
Creatinine, Ser: 0.87 mg/dL (ref 0.40–1.20)
GFR: 81.02 mL/min (ref >60.00)
Glucose, Bld: 78 mg/dL (ref 70–99)
Potassium: 4.1 mEq/L (ref 3.5–5.1)
Sodium: 139 mEq/L (ref 135–145)
Total Bilirubin: 0.5 mg/dL (ref 0.2–1.2)
Total Protein: 7.5 g/dL (ref 6.0–8.3)

## 2022-08-02 ENCOUNTER — Telehealth: Payer: Self-pay | Admitting: Family Medicine

## 2022-08-02 NOTE — Telephone Encounter (Signed)
Patient was contacted concerning her labs which were normal.

## 2022-08-29 ENCOUNTER — Other Ambulatory Visit: Payer: Self-pay | Admitting: Family Medicine

## 2022-08-29 DIAGNOSIS — R4589 Other symptoms and signs involving emotional state: Secondary | ICD-10-CM

## 2022-08-29 DIAGNOSIS — N946 Dysmenorrhea, unspecified: Secondary | ICD-10-CM

## 2022-08-30 ENCOUNTER — Other Ambulatory Visit (HOSPITAL_BASED_OUTPATIENT_CLINIC_OR_DEPARTMENT_OTHER): Payer: Self-pay

## 2022-08-30 MED ORDER — BUPROPION HCL 75 MG PO TABS
75.0000 mg | ORAL_TABLET | Freq: Two times a day (BID) | ORAL | 0 refills | Status: DC
  Filled 2022-08-30: qty 60, 30d supply, fill #0

## 2022-08-30 MED ORDER — VORTIOXETINE HBR 20 MG PO TABS
20.0000 mg | ORAL_TABLET | Freq: Every day | ORAL | 0 refills | Status: DC
  Filled 2022-08-30: qty 30, 30d supply, fill #0

## 2022-08-30 MED ORDER — BLISOVI 24 FE 1-20 MG-MCG(24) PO TABS
1.0000 | ORAL_TABLET | Freq: Every day | ORAL | 0 refills | Status: DC
  Filled 2022-08-30: qty 84, 84d supply, fill #0

## 2022-09-10 ENCOUNTER — Other Ambulatory Visit (HOSPITAL_BASED_OUTPATIENT_CLINIC_OR_DEPARTMENT_OTHER): Payer: Self-pay

## 2022-09-10 ENCOUNTER — Ambulatory Visit (INDEPENDENT_AMBULATORY_CARE_PROVIDER_SITE_OTHER): Payer: 59 | Admitting: Family Medicine

## 2022-09-10 ENCOUNTER — Encounter: Payer: Self-pay | Admitting: Family Medicine

## 2022-09-10 VITALS — BP 104/67 | HR 78 | Temp 97.9°F | Resp 97 | Wt 144.4 lb

## 2022-09-10 DIAGNOSIS — M3500 Sicca syndrome, unspecified: Secondary | ICD-10-CM | POA: Diagnosis not present

## 2022-09-10 DIAGNOSIS — K120 Recurrent oral aphthae: Secondary | ICD-10-CM | POA: Diagnosis not present

## 2022-09-10 DIAGNOSIS — N946 Dysmenorrhea, unspecified: Secondary | ICD-10-CM

## 2022-09-10 DIAGNOSIS — R4589 Other symptoms and signs involving emotional state: Secondary | ICD-10-CM | POA: Diagnosis not present

## 2022-09-10 DIAGNOSIS — R768 Other specified abnormal immunological findings in serum: Secondary | ICD-10-CM | POA: Diagnosis not present

## 2022-09-10 DIAGNOSIS — B360 Pityriasis versicolor: Secondary | ICD-10-CM

## 2022-09-10 MED ORDER — FLUCONAZOLE 150 MG PO TABS
150.0000 mg | ORAL_TABLET | ORAL | 0 refills | Status: AC
  Filled 2022-09-10: qty 3, 21d supply, fill #0
  Filled 2022-09-11: qty 1, 7d supply, fill #0

## 2022-09-10 MED ORDER — HYDROXYZINE HCL 25 MG PO TABS
25.0000 mg | ORAL_TABLET | Freq: Three times a day (TID) | ORAL | 5 refills | Status: DC | PRN
  Filled 2022-09-10: qty 90, 15d supply, fill #0

## 2022-09-10 MED ORDER — VORTIOXETINE HBR 20 MG PO TABS
20.0000 mg | ORAL_TABLET | Freq: Every day | ORAL | 3 refills | Status: DC
Start: 2022-09-10 — End: 2023-10-28
  Filled 2022-09-10 – 2022-10-04 (×2): qty 90, 90d supply, fill #0
  Filled 2023-01-04: qty 90, 90d supply, fill #1
  Filled 2023-04-12: qty 90, 90d supply, fill #2
  Filled 2023-07-01: qty 90, 90d supply, fill #3

## 2022-09-10 MED ORDER — BUPROPION HCL 75 MG PO TABS
75.0000 mg | ORAL_TABLET | Freq: Two times a day (BID) | ORAL | 1 refills | Status: DC
Start: 2022-09-10 — End: 2023-04-12
  Filled 2022-09-10 – 2022-10-04 (×2): qty 180, 90d supply, fill #0
  Filled 2022-10-05: qty 120, 60d supply, fill #0
  Filled 2022-12-04: qty 120, 60d supply, fill #1
  Filled 2023-02-10: qty 120, 60d supply, fill #2

## 2022-09-10 MED ORDER — BLISOVI 24 FE 1-20 MG-MCG(24) PO TABS
1.0000 | ORAL_TABLET | Freq: Every day | ORAL | 4 refills | Status: AC
Start: 2022-09-10 — End: 2023-11-04
  Filled 2022-09-10 – 2022-12-04 (×2): qty 84, 84d supply, fill #0
  Filled 2023-03-28: qty 84, 84d supply, fill #1
  Filled 2023-07-01: qty 84, 84d supply, fill #2

## 2022-09-10 MED ORDER — HYDROXYCHLOROQUINE SULFATE 200 MG PO TABS
200.0000 mg | ORAL_TABLET | Freq: Every day | ORAL | 3 refills | Status: DC
  Filled 2022-09-10: qty 90, 90d supply, fill #0
  Filled 2023-01-04: qty 90, 90d supply, fill #1
  Filled 2023-04-12: qty 90, 90d supply, fill #2
  Filled 2023-07-01: qty 90, 90d supply, fill #3

## 2022-09-10 MED ORDER — CLOTRIMAZOLE 1 % EX SOLN
1.0000 | Freq: Two times a day (BID) | CUTANEOUS | 2 refills | Status: AC | PRN
  Filled 2022-09-10: qty 60, 30d supply, fill #0

## 2022-09-10 NOTE — Patient Instructions (Addendum)
Return in about 1 year (around 09/11/2023) for Routine chronic condition follow-up.        Great to see you today.  I have refilled the medication(s) we provide.   If labs were collected, we will inform you of lab results once received either by echart message or telephone call.   - echart message- for normal results that have been seen by the patient already.   - telephone call: abnormal results or if patient has not viewed results in their echart.

## 2022-09-10 NOTE — Progress Notes (Signed)
[name] , 03/02/1976, 47 y.o., female MRN: [id number] Patient Care Team    Relationship Specialty Notifications Start End  Natalia Leatherwood, DO PCP - General Family Medicine  08/09/20   Marcelle Overlie, MD Consulting Physician Obstetrics and Gynecology  09/04/17   Jill Side, OD Referring Physician   11/27/21   Cleone Slim May, MD Referring Physician   11/27/21   Iva Boop, MD Consulting Physician Gastroenterology  11/27/21     Chief Complaint  Patient presents with   Management of Chronic Conditions     Subjective: [name] is a 47 y.o. Pt presents for chronic conditions management.   Dysmenorrhea/Encounter for birth control pills maintenance Patient started back on birth control pills 2021  for dysmenorrhea.  She has noticed hot flashes, mostly at night.  Her mother started perimenopause/menopausal symptoms earlier in life/40s as well.  She would like to continue with the birth control pills at this time.  Her Pap is up-to-date.  She follows with gynecology for cervical cancer screenings.  Depressed mood She reports she has been prescribed Trintellix 20 mg daily and Wellbutrin 75 mg twice daily for quite some time.  This regimen has still working well for her and she would like to continue.     Sjogren's disease.  Patient reports since starting the Plaquenil she does feel her symptoms are improving.  Symptoms are still present but slightly better.  Prior note: Patient has noticed in the last couple of months she has had notably worsened dry eyes, dry mouth with ulcers, arthralgias (especially in her wrist) and fatigue.  She reports 1 episode of tachycardia without associated symptoms, with a heart rate of 155 with no activity. Patient also wears hearing aids, but states she has noticed that he decrease in her hearing acuity as well. She has a history of ischemic colitis of unknown etiology and depression.   She has been seen by her ophthalmologist  and her rheumatologist. She is prescribed Restasis and had been using chlorhexidine mouth solution the ulcerations of the mouth. She has had a significantly high ANA with repeat confirmation of 1:1280-nuclear homogenous.  Negative reflex panel.  IgG and IgM's have been normal.  Complements normal. Vitamin D levels were normal August 2023 with a level of 61.88.  Patient has continued to supplement with vitamin D.  Skin rash: Patient reports after being on the sun she noticed white patches across to her chest and back.  She has had a similar rash in the past that responded to antifungal.     11/27/2021    1:42 PM 08/09/2020    3:28 PM 08/25/2018   11:36 AM 03/04/2018    4:30 PM 09/04/2017    9:44 AM  Depression screen PHQ 2/9  Decreased Interest 0 0 0 0 1  Down, Depressed, Hopeless 0 0 0 0 1  PHQ - 2 Score 0 0 0 0 2  Altered sleeping 3  0 0 3  Tired, decreased energy 2  0 0 2  Change in appetite 0  0 0 0  Feeling bad or failure about yourself  0  0 0 0  Trouble concentrating 0  0 0 1  Moving slowly or fidgety/restless 0  0 0 0  Suicidal thoughts 0  0 0 0  PHQ-9 Score 5  0 0 8  Difficult doing work/chores   Not difficult at all Not difficult at all Somewhat difficult    Allergies  Allergen  Reactions   Morphine Other (See Comments)    Possible anaphylaxis/airway swelling causing delayed extubation   Cefaclor Hives   Social History   Social History Narrative   Married 2 children 1 son 1 daughter   Son has developmental abnormalities and has required several surgeries   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   Menstrual migraine    menstrual migraines   PONV (postoperative nausea and vomiting)    POSTPARTUM DEPRESSION 09/26/2009   Qualifier: Diagnosis of  By: Peggyann Juba FNP, Melissa S    Rectal bleeding 07/16/2021   Umbilical hernia    Past Surgical History:  Procedure Laterality Date   ANKLE SURGERY     APPENDECTOMY     CESAREAN SECTION     x 2   GANGLION CYST EXCISION     X 2   HERNIA REPAIR     VENTRAL   INSERTION OF MESH N/A 01/07/2017   Procedure: INSERTION OF MESH;  Surgeon: Axel Filler, MD;  Location: [facility] OR;  Service: General;  Laterality: N/A;   INTUSSUSCEPTION REPAIR     MOUTH SURGERY     SHOULDER ARTHROSCOPY     x 2 right   TONSILLECTOMY AND ADENOIDECTOMY     TUBAL LIGATION     TYMPANOSTOMY TUBE PLACEMENT     UMBILICAL HERNIA REPAIR N/A 01/07/2017   Procedure: LAPAROSCOPIC UMBILICAL HERNIA REPAIR WITH MESH;  Surgeon: Axel Filler, MD;  Location: [facility] OR;  Service: General;  Laterality: N/A;   WISDOM TOOTH EXTRACTION     Family History  Problem Relation Age of Onset   Parkinson's disease Mother    Depression Mother    Colon polyps Father    Hypothyroidism Father    Leukemia Father    Other Brother        essential tremor   Hypertension Brother    Polycystic kidney disease Maternal Uncle    Cancer Maternal Uncle        GBM   Polycystic kidney disease Maternal Grandmother    Pancreatic cancer Maternal Grandfather    Heart disease Paternal Grandmother    Diabetes Paternal Grandmother    Alzheimer's disease Paternal Grandfather    Cancer Cousin        breast   Breast cancer Cousin    Cancer Cousin        breast   Breast cancer Cousin    Colon cancer Neg Hx    Rectal cancer Neg Hx    Stomach cancer Neg Hx    Esophageal cancer Neg Hx    Allergies as of 09/10/2022       Reactions   Morphine Other (See Comments)   Possible  anaphylaxis/airway swelling causing delayed extubation   Cefaclor Hives        Medication List        Accurate as of September 10, 2022 11:59 PM. If you have any questions, ask your nurse or doctor.  STOP taking these medications    chlorhexidine 0.12 % solution Commonly known as: PERIDEX Stopped by: Felix Pacini, DO       TAKE these medications    aspirin-acetaminophen-caffeine 250-250-65 MG tablet Commonly known as: EXCEDRIN MIGRAINE Take 2 tablets by mouth daily as needed for headache or migraine.   Blisovi 24 Fe 1-20 MG-MCG(24) tablet Generic drug: Norethindrone Acetate-Ethinyl Estrad-FE TAKE 1 TABLET BY MOUTH ONCE A DAY   buPROPion 75 MG tablet Commonly known as: WELLBUTRIN Take 1 tablet (75 mg total) by mouth 2 (two) times daily.   cetirizine 10 MG tablet Commonly known as: ZYRTEC Take 10 mg by mouth daily.   clotrimazole 1 % external solution Commonly known as: LOTRIMIN Apply 1 Application topically 2 (two) times daily as needed. Started by: Felix Pacini, DO   fluconazole 150 MG tablet Commonly known as: DIFLUCAN Take 1 tablet (150 mg total) by mouth once a week for 4 doses. Started by: Felix Pacini, DO   hydroxychloroquine 200 MG tablet Commonly known as: PLAQUENIL Take 1 tablet (200 mg total) by mouth daily.   hydrOXYzine 25 MG tablet Commonly known as: ATARAX Take 1-2 tablets (25-50 mg total) by mouth 3 (three) times daily as needed. Started by: Felix Pacini, DO   multivitamin with minerals Tabs tablet Take 1 tablet by mouth daily.   Restasis 0.05 % ophthalmic emulsion Generic drug: cycloSPORINE Place 1 drop into both eyes every 12 (twelve) hours.   Vitamin D 50 MCG (2000 UT) Caps Take by mouth.   vortioxetine HBr 20 MG Tabs tablet Commonly known as: Trintellix Take 1 tablet (20 mg total) by mouth daily.        All past medical history, surgical history, allergies, family history, immunizations andmedications were updated in  the EMR today and reviewed under the history and medication portions of their EMR.     ROS Negative, with the exception of above mentioned in HPI   Objective:  BP 104/67   Pulse 78   Temp 97.9 F (36.6 C)   Resp (!) 97   Wt 144 lb 6.4 oz (65.5 kg)   BMI 23.66 kg/m  Body mass index is 23.66 kg/m. Physical Exam Vitals and nursing note reviewed.  Constitutional:      General: She is not in acute distress.    Appearance: Normal appearance. She is normal weight. She is not ill-appearing or toxic-appearing.  HENT:     Head: Normocephalic and atraumatic.  Eyes:     General: No scleral icterus.       Right eye: No discharge.        Left eye: No discharge.     Extraocular Movements: Extraocular movements intact.     Conjunctiva/sclera: Conjunctivae normal.     Pupils: Pupils are equal, round, and reactive to light.  Cardiovascular:     Rate and Rhythm: Normal rate and regular rhythm.  Pulmonary:     Effort: Pulmonary effort is normal.     Breath sounds: Normal breath sounds.  Skin:    Findings: Rash (Hypopigmented flat scaly rash over chest and back.) present.  Neurological:     Mental Status: She is alert and oriented to person, place, and time. Mental status is at baseline.     Motor: No weakness.     Coordination: Coordination normal.     Gait: Gait normal.  Psychiatric:        Mood and Affect: Mood normal.        Behavior: Behavior normal.  Thought Content: Thought content normal.        Judgment: Judgment normal.     No results found. No results found. No results found for this or any previous visit (from the past 24 hour(s)).  Assessment/Plan: ANALISE DOMIN is a 47 y.o. female present for OV for  Sjogren syndrome, unspecified (HCC) Stable Continue Plaquenil 200 mg daily Continue Restasis per optometry Recommend Biotene daily for mouth dryness.  Dysmenorrhea/Encounter for birth control pills maintenance Stable She is having hot  flashes/perimenopausal symptoms. Continue Loestrin 24 FE Follow per routine to gynecology for cervical cancer screening  Tinea versicolor: Diflucan x 4, 1 tab weekly Clotrimazole twice daily 4 weeks Vistaril as needed  Depressed mood Stable Continue Trintellix 20 mg daily Continue Wellbutrin 75 mg twice daily Vistaril as needed Follow-up yearly, unless needed sooner.   Reviewed expectations re: course of current medical issues. Discussed self-management of symptoms. Outlined signs and symptoms indicating need for more acute intervention. Patient verbalized understanding and all questions were answered. Patient received an After-Visit Summary.    No orders of the defined types were placed in this encounter.  Meds ordered this encounter  Medications   buPROPion (WELLBUTRIN) 75 MG tablet    Sig: Take 1 tablet (75 mg total) by mouth 2 (two) times daily.    Dispense:  180 tablet    Refill:  1   Norethindrone Acetate-Ethinyl Estrad-FE (BLISOVI 24 FE) 1-20 MG-MCG(24) tablet    Sig: TAKE 1 TABLET BY MOUTH ONCE A DAY    Dispense:  84 tablet    Refill:  4   vortioxetine HBr (TRINTELLIX) 20 MG TABS tablet    Sig: Take 1 tablet (20 mg total) by mouth daily.    Dispense:  90 tablet    Refill:  3   hydroxychloroquine (PLAQUENIL) 200 MG tablet    Sig: Take 1 tablet (200 mg total) by mouth daily.    Dispense:  90 tablet    Refill:  3   clotrimazole (LOTRIMIN) 1 % external solution    Sig: Apply 1 Application topically 2 (two) times daily as needed.    Dispense:  60 mL    Refill:  2   hydrOXYzine (ATARAX) 25 MG tablet    Sig: Take 1-2 tablets (25-50 mg total) by mouth 3 (three) times daily as needed.    Dispense:  90 tablet    Refill:  5   fluconazole (DIFLUCAN) 150 MG tablet    Sig: Take 1 tablet (150 mg total) by mouth once a week for 4 doses.    Dispense:  4 tablet    Refill:  0   Referral Orders  No referral(s) requested today     Note is dictated utilizing voice  recognition software. Although note has been proof read prior to signing, occasional typographical errors still can be missed. If any questions arise, please do not hesitate to call for verification.   electronically signed by:  Felix Pacini, DO  [facility] - OR

## 2022-09-11 ENCOUNTER — Other Ambulatory Visit (HOSPITAL_BASED_OUTPATIENT_CLINIC_OR_DEPARTMENT_OTHER): Payer: Self-pay

## 2022-09-12 ENCOUNTER — Other Ambulatory Visit (HOSPITAL_BASED_OUTPATIENT_CLINIC_OR_DEPARTMENT_OTHER): Payer: Self-pay

## 2022-09-17 DIAGNOSIS — B360 Pityriasis versicolor: Secondary | ICD-10-CM | POA: Insufficient documentation

## 2022-09-17 HISTORY — DX: Pityriasis versicolor: B36.0

## 2022-09-30 ENCOUNTER — Other Ambulatory Visit: Payer: Self-pay | Admitting: Family Medicine

## 2022-09-30 DIAGNOSIS — Z1231 Encounter for screening mammogram for malignant neoplasm of breast: Secondary | ICD-10-CM

## 2022-10-04 ENCOUNTER — Other Ambulatory Visit (HOSPITAL_BASED_OUTPATIENT_CLINIC_OR_DEPARTMENT_OTHER): Payer: Self-pay

## 2022-10-05 ENCOUNTER — Other Ambulatory Visit (HOSPITAL_BASED_OUTPATIENT_CLINIC_OR_DEPARTMENT_OTHER): Payer: Self-pay

## 2022-10-22 ENCOUNTER — Ambulatory Visit
Admission: RE | Admit: 2022-10-22 | Discharge: 2022-10-22 | Disposition: A | Discharge status: Home / Self Care | Payer: 59 | Source: Ambulatory Visit | Attending: Family Medicine | Admitting: Family Medicine

## 2022-10-22 DIAGNOSIS — Z1231 Encounter for screening mammogram for malignant neoplasm of breast: Secondary | ICD-10-CM

## 2022-11-07 ENCOUNTER — Other Ambulatory Visit (HOSPITAL_COMMUNITY): Payer: Self-pay

## 2022-12-02 ENCOUNTER — Encounter: Payer: 59 | Admitting: Family Medicine

## 2022-12-02 ENCOUNTER — Telehealth: Payer: Self-pay

## 2022-12-02 NOTE — Telephone Encounter (Signed)
Pt was schedule for CPE today and wants to see if she can be worked in some time this week for [employer] purposes. Please advise

## 2022-12-02 NOTE — Telephone Encounter (Signed)
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

## 2022-12-03 ENCOUNTER — Ambulatory Visit (INDEPENDENT_AMBULATORY_CARE_PROVIDER_SITE_OTHER): Payer: 59 | Admitting: Family Medicine

## 2022-12-03 ENCOUNTER — Encounter: Payer: Self-pay | Admitting: Family Medicine

## 2022-12-03 VITALS — BP 106/68 | HR 89 | Temp 98.4°F | Resp 19 | Ht 65.5 in | Wt 142.0 lb

## 2022-12-03 DIAGNOSIS — Z0000 Encounter for general adult medical examination without abnormal findings: Secondary | ICD-10-CM

## 2022-12-03 DIAGNOSIS — Z1283 Encounter for screening for malignant neoplasm of skin: Secondary | ICD-10-CM | POA: Diagnosis not present

## 2022-12-03 NOTE — Progress Notes (Addendum)
Complete physical exam  Patient: [name]   DOB: 09/19/1975   47 y.o. Female  MRN: [id number]  Subjective:   Patient is here for a physical with no concerns.    [name] is a 47 y.o. female who presents today for a complete physical exam. She reports consuming a general diet. Patient participates in power walking and biking, at least 4 days a week, lasting 45 minutes.  She generally feels fairly well. She reports she sleeps between fairly well and poorly. She does not have additional problems to discuss today.   Most recent fall risk assessment:    12/03/2022   12:25 PM  Fall Risk   Falls in the past year? 0  Number falls in past yr: 0  Injury with Fall? 0  Risk for fall due to : No Fall Risks  Follow up Falls evaluation completed     Most recent depression screenings:    12/03/2022   12:25 PM 09/10/2022   12:09 PM  PHQ 2/9 Scores  PHQ - 2 Score 0 0  PHQ- 9 Score 0     Vision:Within last year and Dental: No current dental problems and Receives regular dental care  Patient Active Problem List   Diagnosis Date Noted   Tinea versicolor 09/17/2022   Sjogren syndrome, unspecified (HCC) 06/18/2022   Aphthous ulcer of mouth 06/18/2022   ANA positive 01/25/2021   Ischemic colitis (HCC) 11/15/2020   Encounter for birth control pills maintenance 08/09/2020   Depressed mood 02/11/2017   Past Medical History:  Diagnosis Date   Anxiety    Asthma    PMH: exercise induced as a child   Bruising 08/07/2010   Depressed mood 02/11/2017   Depression    Dysmenorrhea 08/09/2020   EDS (Ehlers-Danlos syndrome)    Family history of adverse reaction to anesthesia    Son PONV   Gilberts syndrome    Hemorrhoids    Ischemic colitis (HCC) 11/15/2020   Colonoscopy 10/2020-biopsy-proven ischemic colitis.   Menstrual migraine    menstrual migraines   PONV (postoperative nausea and vomiting)    POSTPARTUM DEPRESSION 09/26/2009   Qualifier: Diagnosis of  By: Peggyann Juba  FNP, Melissa S    Rectal bleeding 07/16/2021   Umbilical hernia    Past Surgical History:  Procedure Laterality Date   ANKLE SURGERY     APPENDECTOMY     CESAREAN SECTION     x 2   GANGLION CYST EXCISION     X 2   HERNIA REPAIR     VENTRAL   INSERTION OF MESH N/A 01/07/2017   Procedure: INSERTION OF MESH;  Surgeon: Axel Filler, MD;  Location: [facility] OR;  Service: General;  Laterality: N/A;   INTUSSUSCEPTION REPAIR     MOUTH SURGERY     SHOULDER ARTHROSCOPY     x 2 right   TONSILLECTOMY AND ADENOIDECTOMY     TUBAL LIGATION     TYMPANOSTOMY TUBE PLACEMENT     UMBILICAL HERNIA REPAIR N/A 01/07/2017   Procedure: LAPAROSCOPIC UMBILICAL HERNIA REPAIR WITH MESH;  Surgeon: Axel Filler, MD;  Location: [facility] OR;  Service: General;  Laterality: N/A;   WISDOM TOOTH EXTRACTION     Social History   Tobacco Use   Smoking status: Never   Smokeless tobacco: Never  Vaping Use   Vaping status: Never Used  Substance Use Topics   Alcohol use: Yes    Comment: 1 monthly   Drug use: No   Social History  Socioeconomic History   Marital status: Married    Spouse name: Not on file   Number of children: 2   Years of education: Not on file   Highest education level: Professional school degree (e.g., MD, DDS, DVM, JD)  Occupational History   Occupation: physician  Tobacco Use   Smoking status: Never   Smokeless tobacco: Never  Vaping Use   Vaping status: Never Used  Substance and Sexual Activity   Alcohol use: Yes    Comment: 1 monthly   Drug use: No   Sexual activity: Yes  Other Topics Concern   Not on file  Social History Narrative   Married 2 children 1 son 1 daughter   Son has developmental abnormalities and has required several surgeries   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 [employer]   Never tobacco no drug use less than 1 alcoholic beverage on average per day   Social Determinants of Health   Financial Resource Strain: Low Risk  (06/17/2022)   Overall Financial  Resource Strain (CARDIA)    Difficulty of Paying Living Expenses: Not hard at all  Food Insecurity: No Food Insecurity (06/17/2022)   Hunger Vital Sign    Worried About Running Out of Food in the Last Year: Never true    Ran Out of Food in the Last Year: Never true  Transportation Needs: No Transportation Needs (06/17/2022)   PRAPARE - Administrator, Civil Service (Medical): No    Lack of Transportation (Non-Medical): No  Physical Activity: Insufficiently Active (06/17/2022)   Exercise Vital Sign    Days of Exercise per Week: 3 days    Minutes of Exercise per Session: 40 min  Stress: Stress Concern Present (06/17/2022)   [employer] of Occupational Health - Occupational Stress Questionnaire    Feeling of Stress : To some extent  Social Connections: Moderately Isolated (06/17/2022)   Social Connection and Isolation Panel [NHANES]    Frequency of Communication with Friends and Family: More than three times a week    Frequency of Social Gatherings with Friends and Family: Once a week    Attends Religious Services: Never    Database administrator or Organizations: No    Attends Engineer, structural: Not on file    Marital Status: Married  Catering manager Violence: Not on file   Family Status  Relation Name Status   Mother  Alive   Father  Alive   Brother  Alive   Mat Uncle  Alive   Mat Uncle  Deceased   MGM  Deceased   MGF  Deceased   PGM  Deceased   PGF  Deceased   Cousin  Alive   Cousin  Alive   Neg Hx  (Not Specified)  No partnership data on file   Family History  Problem Relation Age of Onset   Parkinson's disease Mother    Depression Mother    Colon polyps Father    Hypothyroidism Father    Leukemia Father    Other Brother        essential tremor   Hypertension Brother    Polycystic kidney disease Maternal Uncle    Cancer Maternal Uncle        GBM   Polycystic kidney disease Maternal Grandmother    Pancreatic cancer Maternal Grandfather     Heart disease Paternal Grandmother    Diabetes Paternal Grandmother    Alzheimer's disease Paternal Grandfather    Cancer Cousin  breast   Breast cancer Cousin    Cancer Cousin        breast   Breast cancer Cousin    Colon cancer Neg Hx    Rectal cancer Neg Hx    Stomach cancer Neg Hx    Esophageal cancer Neg Hx    Allergies  Allergen Reactions   Morphine Other (See Comments)    Possible anaphylaxis/airway swelling causing delayed extubation   Cefaclor Hives    Patient Care Team: Natalia Leatherwood, DO as PCP - General (Family Medicine) Marcelle Overlie, MD as Consulting Physician (Obstetrics and Gynecology) Jill Side, OD as Referring Physician Cleone Slim May, MD as Referring Physician Iva Boop, MD as Consulting Physician (Gastroenterology)   Outpatient Medications Prior to Visit  Medication Sig   aspirin-acetaminophen-caffeine (EXCEDRIN MIGRAINE) [phone] MG tablet Take 2 tablets by mouth daily as needed for headache or migraine.   buPROPion (WELLBUTRIN) 75 MG tablet Take 1 tablet (75 mg total) by mouth 2 (two) times daily.   cetirizine (ZYRTEC) 10 MG tablet Take 10 mg by mouth daily.   Cholecalciferol (VITAMIN D) 50 MCG (2000 UT) CAPS Take by mouth.   clotrimazole (LOTRIMIN) 1 % external solution Apply 1 Application topically 2 (two) times daily as needed.   cycloSPORINE (RESTASIS) 0.05 % ophthalmic emulsion Place 1 drop into both eyes every 12 (twelve) hours.   hydroxychloroquine (PLAQUENIL) 200 MG tablet Take 1 tablet (200 mg total) by mouth daily.   hydrOXYzine (ATARAX) 25 MG tablet Take 1-2 tablets (25-50 mg total) by mouth 3 (three) times daily as needed.   Multiple Vitamin (MULTIVITAMIN WITH MINERALS) TABS tablet Take 1 tablet by mouth daily.   Norethindrone Acetate-Ethinyl Estrad-FE (BLISOVI 24 FE) 1-20 MG-MCG(24) tablet TAKE 1 TABLET BY MOUTH ONCE A DAY   vortioxetine HBr (TRINTELLIX) 20 MG TABS tablet Take 1 tablet (20 mg total) by mouth  daily.   No facility-administered medications prior to visit.   Review of Systems  Constitutional: Negative.   HENT: Negative.    Eyes: Negative.   Respiratory: Negative.    Cardiovascular: Negative.   Gastrointestinal: Negative.   Genitourinary: Negative.   Musculoskeletal: Negative.   Skin: Negative.   Neurological: Negative.   Endo/Heme/Allergies: Negative.   Psychiatric/Behavioral: Negative.        Objective:   BP 106/68   Pulse 89   Temp 98.4 F (36.9 C) (Temporal)   Resp 19   Ht 5' 5.5" (1.664 m)   Wt 142 lb (64.4 kg)   SpO2 100%   BMI 23.27 kg/m    Physical Exam Vitals reviewed.  Constitutional:      General: She is not in acute distress.    Appearance: Normal appearance. She is not ill-appearing, toxic-appearing or diaphoretic.  HENT:     Head: Normocephalic and atraumatic.     Ears:     Comments: Wearing hearing aids in each ear.     Mouth/Throat:     Mouth: Mucous membranes are moist.     Pharynx: Oropharynx is clear. No oropharyngeal exudate or posterior oropharyngeal erythema.  Eyes:     General:        Right eye: No discharge.        Left eye: No discharge.     Conjunctiva/sclera: Conjunctivae normal.     Pupils: Pupils are equal, round, and reactive to light.  Neck:     Thyroid: No thyromegaly.  Cardiovascular:     Rate and Rhythm: Normal rate and regular rhythm.  Heart sounds: Normal heart sounds. No murmur heard.    No friction rub. No gallop.  Pulmonary:     Effort: Pulmonary effort is normal. No respiratory distress.     Breath sounds: Normal breath sounds.  Abdominal:     General: Abdomen is flat. Bowel sounds are normal. There is no distension.     Palpations: Abdomen is soft.     Tenderness: There is no abdominal tenderness. There is no guarding.  Musculoskeletal:        General: Normal range of motion.     Cervical back: Normal range of motion.     Right lower leg: No edema.     Left lower leg: No edema.  Lymphadenopathy:      Cervical: No cervical adenopathy.  Skin:    General: Skin is warm and dry.  Neurological:     General: No focal deficit present.     Mental Status: She is alert and oriented to person, place, and time. Mental status is at baseline.     Motor: No weakness.     Gait: Gait normal.  Psychiatric:        Mood and Affect: Mood normal.        Behavior: Behavior normal.        Thought Content: Thought content normal.        Judgment: Judgment normal.       Assessment & Plan:    Routine Health Maintenance and Physical Exam  Immunization History  Administered Date(s) Administered   Influenza Split 01/03/2012, 01/17/2020   Influenza,inj,Quad PF,6+ Mos 01/20/2014, 12/28/2014, 12/29/2015, 12/13/2016, 12/30/2017   PFIZER(Purple Top)SARS-COV-2 Vaccination 04/07/2019, 04/26/2019, 01/07/2020   Tdap 11/06/2013    Health Maintenance  Topic Date Due   PAP SMEAR-Modifier  12/17/2021   INFLUENZA VACCINE  11/07/2022   DTaP/Tdap/Td (2 - Td or Tdap) 11/07/2023   MAMMOGRAM  10/21/2024   Hepatitis C Screening  Completed   HIV Screening  Completed   HPV VACCINES  Aged Out   COVID-19 Vaccine  Discontinued    Discussed health benefits of physical activity, and encouraged her to engage in regular exercise appropriate for her age and condition.  Annual physical exam  Skin cancer screening -     Ambulatory referral to Dermatology  1.Review health maintenance: -Influenza vaccine: Will obtain with employer -Pap Smear: Patient reports she is going to schedule an appointment with gyn.  2.Placed a referral to dermatology for full body skin assessment for skin cancer. Prefers [facility] location.  3.Physical exam completed with no concerns.  4.Continue with a healthy diet and regular exercise.    Zandra Abts, NP

## 2022-12-04 ENCOUNTER — Other Ambulatory Visit: Payer: Self-pay

## 2022-12-04 ENCOUNTER — Other Ambulatory Visit (HOSPITAL_BASED_OUTPATIENT_CLINIC_OR_DEPARTMENT_OTHER): Payer: Self-pay

## 2023-01-04 ENCOUNTER — Other Ambulatory Visit (HOSPITAL_BASED_OUTPATIENT_CLINIC_OR_DEPARTMENT_OTHER): Payer: Self-pay

## 2023-01-15 ENCOUNTER — Other Ambulatory Visit (HOSPITAL_BASED_OUTPATIENT_CLINIC_OR_DEPARTMENT_OTHER): Payer: Self-pay

## 2023-04-12 ENCOUNTER — Other Ambulatory Visit: Payer: Self-pay | Admitting: Family Medicine

## 2023-04-12 DIAGNOSIS — R4589 Other symptoms and signs involving emotional state: Secondary | ICD-10-CM

## 2023-04-14 ENCOUNTER — Other Ambulatory Visit: Payer: Self-pay

## 2023-04-14 ENCOUNTER — Other Ambulatory Visit (HOSPITAL_BASED_OUTPATIENT_CLINIC_OR_DEPARTMENT_OTHER): Payer: Self-pay

## 2023-04-14 MED ORDER — BUPROPION HCL 75 MG PO TABS
75.0000 mg | ORAL_TABLET | Freq: Two times a day (BID) | ORAL | 0 refills | Status: DC
  Filled 2023-04-14: qty 60, 30d supply, fill #0

## 2023-04-25 IMAGING — MG MM DIGITAL SCREENING BILAT W/ TOMO AND CAD
8 series · 9 of 24 positions shown · non-contrast
Comparison: Previous exam(s).

CLINICAL DATA: Screening.

EXAM:
DIGITAL SCREENING BILATERAL MAMMOGRAM WITH TOMOSYNTHESIS AND CAD
TECHNIQUE: Bilateral screening digital craniocaudal and mediolateral oblique
mammograms were obtained. Bilateral screening digital breast
tomosynthesis was performed. The images were evaluated with
computer-aided detection.

[R MLO synth-2D]
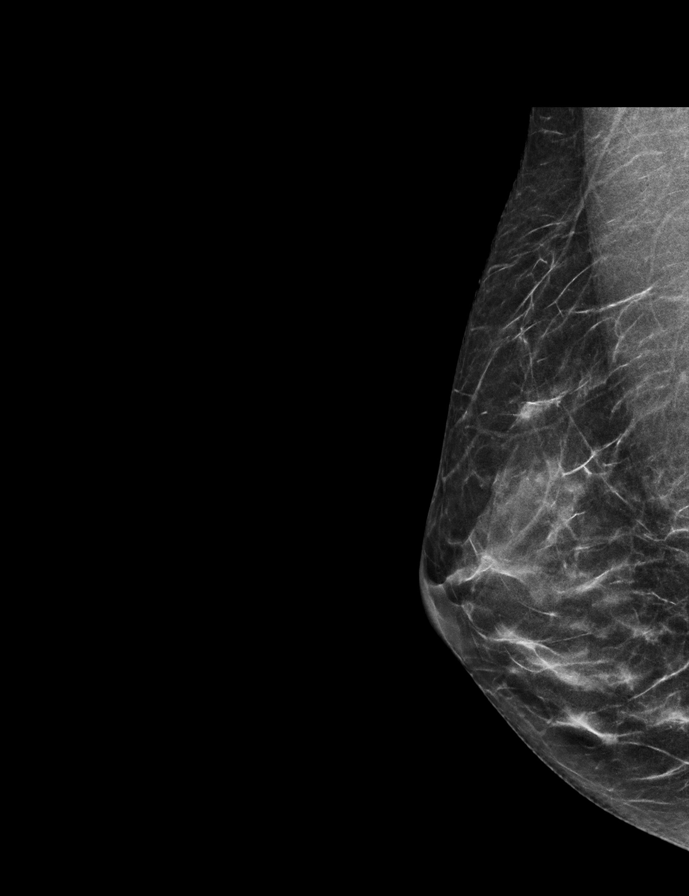

[L CC synth-2D]
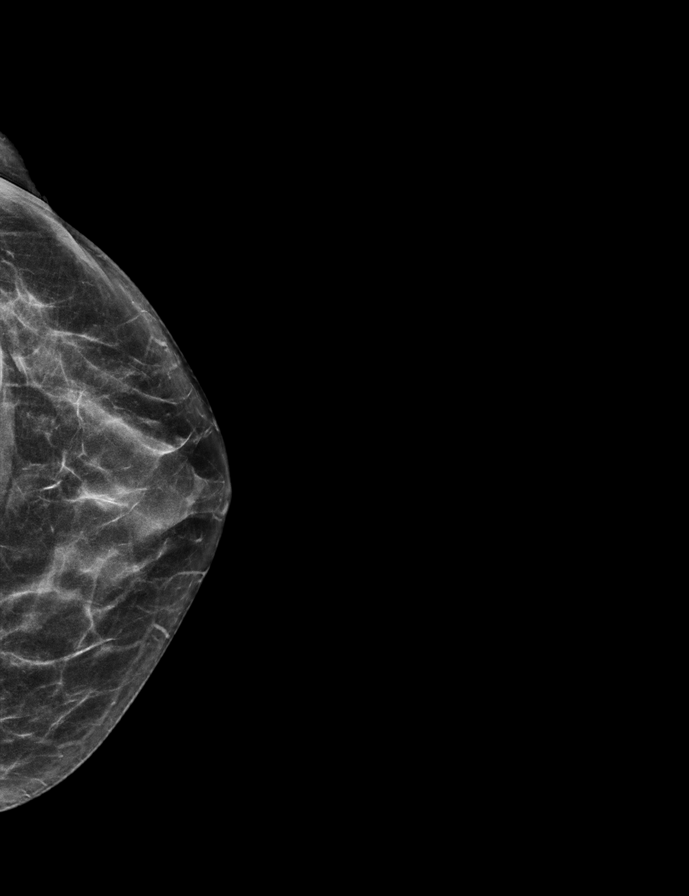

[L MLO synth-2D]
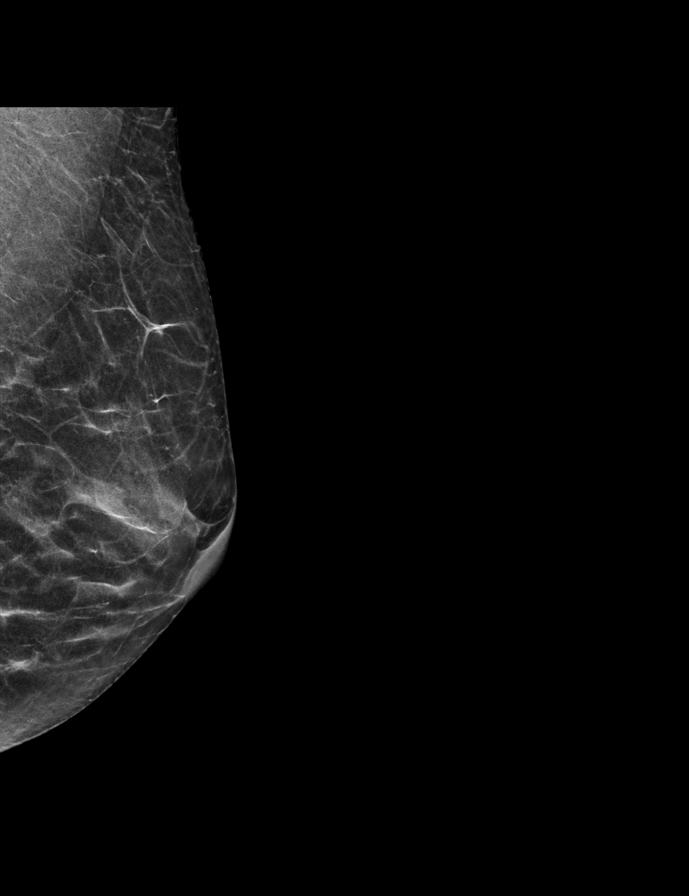

[R CC synth-2D]
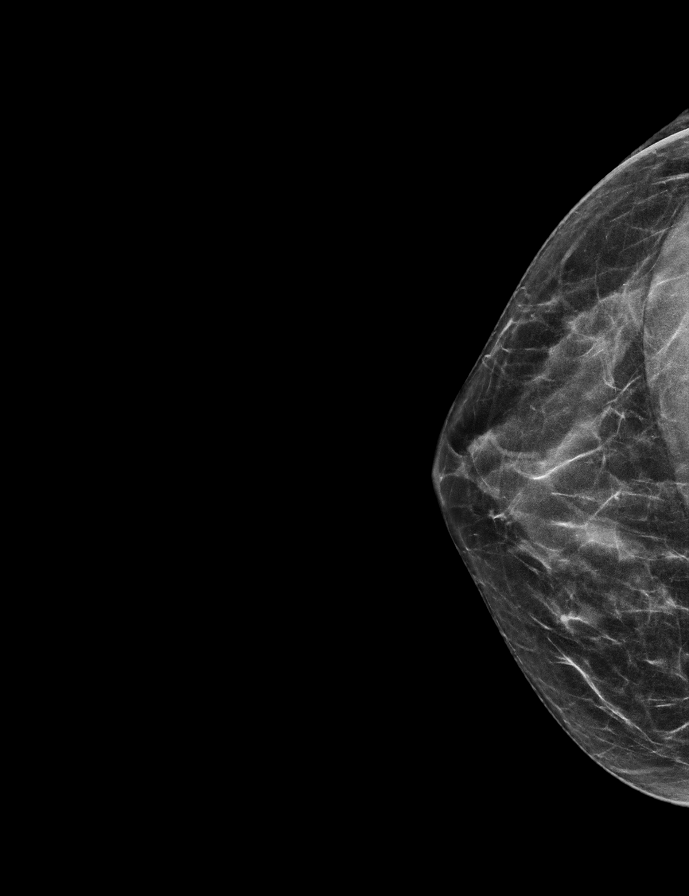

[L MLO tomo · 2 of 44 frames shown]
[frame 15/44]
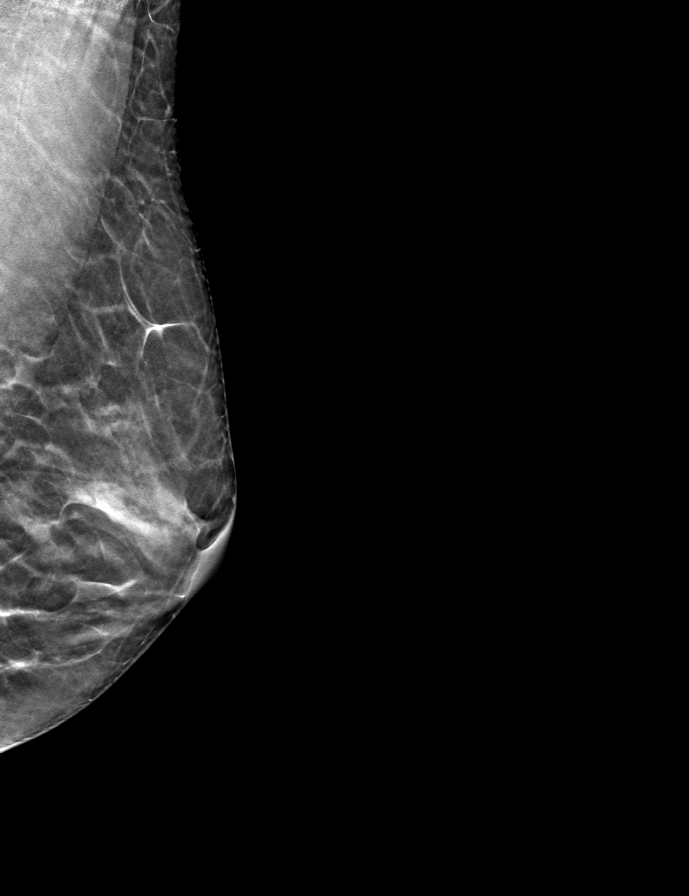
[frame 23/44]
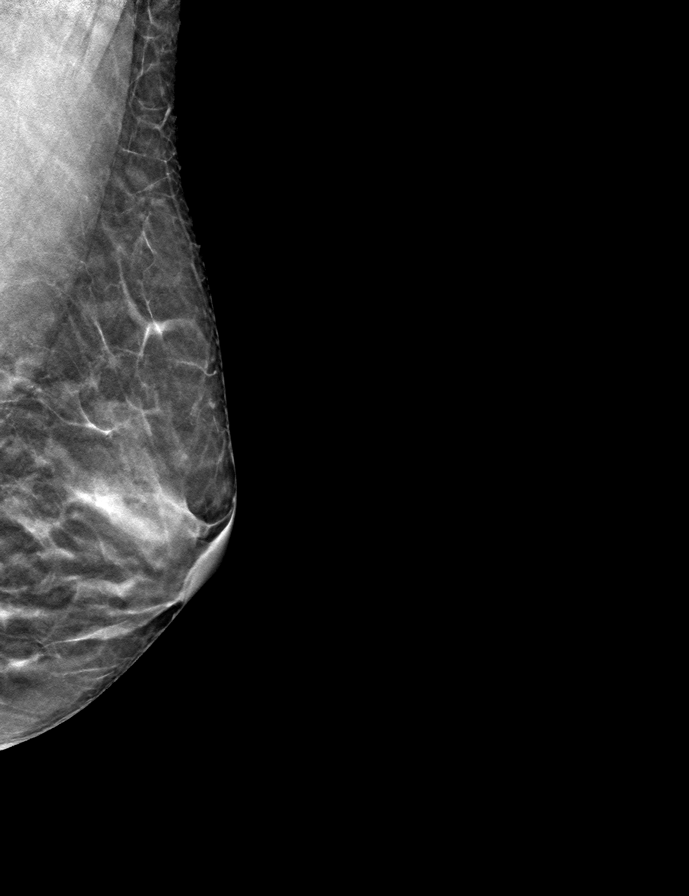

[L CC tomo · tomo slice 28/55.0]
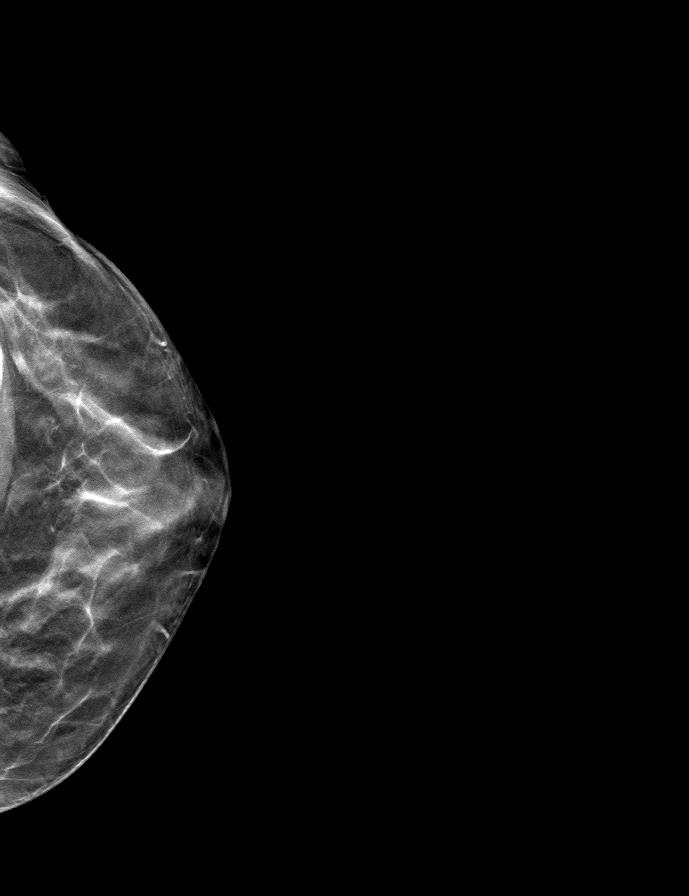

[R MLO tomo · tomo slice 24/47.0]
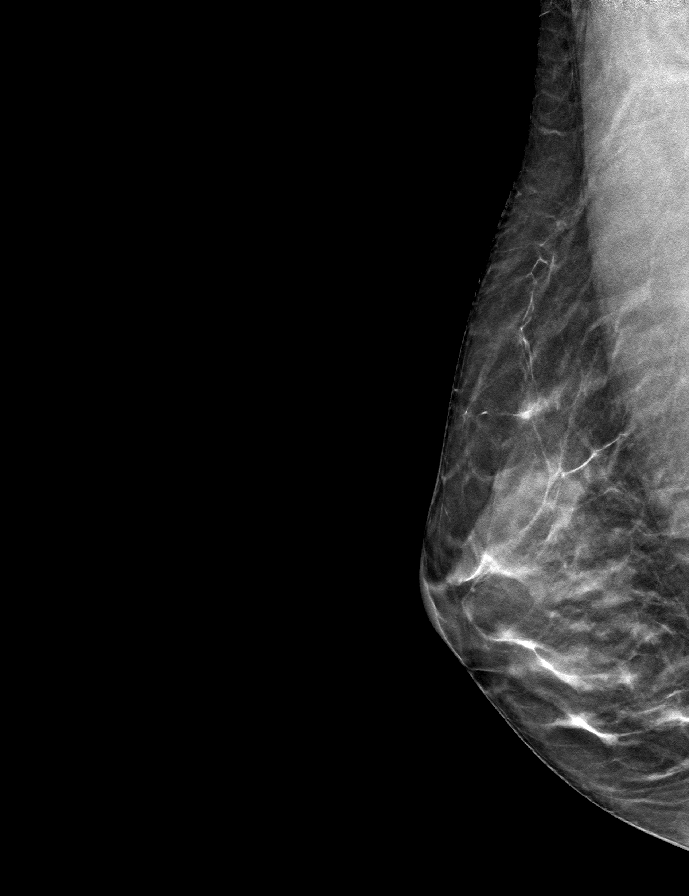

[R CC tomo · tomo slice 27/52.0]
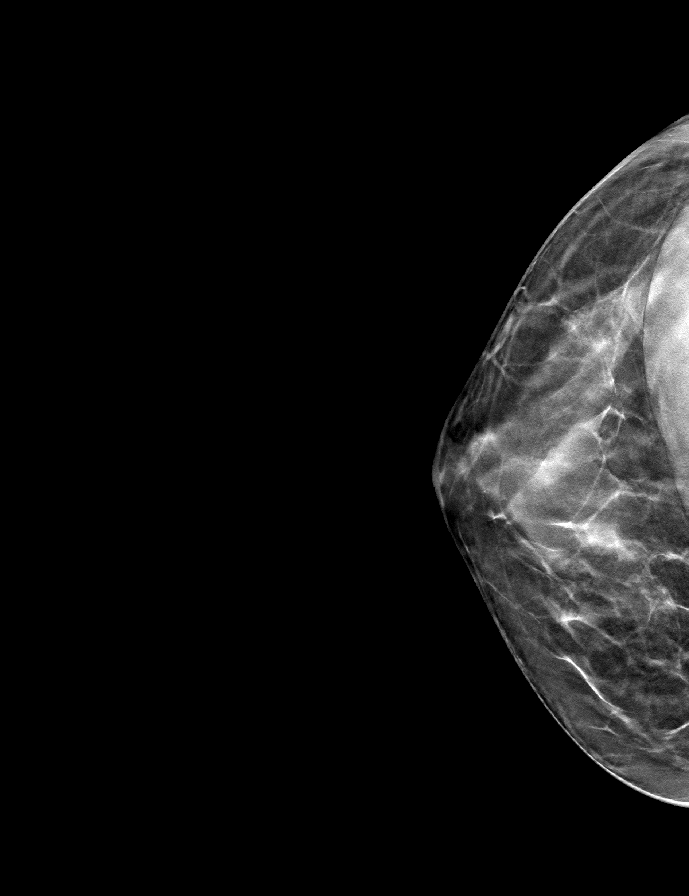

[9 of 24 positions shown; findings below may reference images not displayed]

ACR Breast Density Category c: The breast tissue is heterogeneously
dense, which may obscure small masses.
FINDINGS: There are no findings suspicious for malignancy.
IMPRESSION: No mammographic evidence of malignancy. A result letter of this
screening mammogram will be mailed directly to the patient.

RECOMMENDATION:
Screening mammogram in one year. (Code:Q3-W-BC3)

BI-RADS CATEGORY  1: Negative.

## 2023-05-12 ENCOUNTER — Other Ambulatory Visit: Payer: Self-pay | Admitting: Family Medicine

## 2023-05-12 ENCOUNTER — Other Ambulatory Visit (HOSPITAL_BASED_OUTPATIENT_CLINIC_OR_DEPARTMENT_OTHER): Payer: Self-pay

## 2023-05-12 DIAGNOSIS — R4589 Other symptoms and signs involving emotional state: Secondary | ICD-10-CM

## 2023-05-12 MED ORDER — BUPROPION HCL 75 MG PO TABS
75.0000 mg | ORAL_TABLET | Freq: Two times a day (BID) | ORAL | 0 refills | Status: DC
Start: 2023-05-12 — End: 2023-07-15
  Filled 2023-05-12: qty 90, 45d supply, fill #0

## 2023-07-01 ENCOUNTER — Other Ambulatory Visit: Payer: Self-pay

## 2023-07-01 ENCOUNTER — Other Ambulatory Visit: Payer: Self-pay | Admitting: Family Medicine

## 2023-07-01 DIAGNOSIS — R4589 Other symptoms and signs involving emotional state: Secondary | ICD-10-CM

## 2023-07-02 ENCOUNTER — Other Ambulatory Visit (HOSPITAL_BASED_OUTPATIENT_CLINIC_OR_DEPARTMENT_OTHER): Payer: Self-pay

## 2023-07-02 ENCOUNTER — Encounter (HOSPITAL_BASED_OUTPATIENT_CLINIC_OR_DEPARTMENT_OTHER): Payer: Self-pay

## 2023-07-14 ENCOUNTER — Encounter: Payer: Self-pay | Admitting: Family Medicine

## 2023-07-15 ENCOUNTER — Other Ambulatory Visit: Payer: Self-pay | Admitting: Family Medicine

## 2023-07-15 ENCOUNTER — Other Ambulatory Visit (HOSPITAL_BASED_OUTPATIENT_CLINIC_OR_DEPARTMENT_OTHER): Payer: Self-pay

## 2023-07-15 DIAGNOSIS — R4589 Other symptoms and signs involving emotional state: Secondary | ICD-10-CM

## 2023-07-15 MED ORDER — ONDANSETRON 4 MG PO TBDP: 4.0000 mg | ORAL_TABLET | Freq: Three times a day (TID) | ORAL | 0 refills | Status: DC | PRN

## 2023-07-15 MED ORDER — SCOPOLAMINE 1 MG/3DAYS TD PT72: 1.0000 | MEDICATED_PATCH | TRANSDERMAL | 0 refills | Status: DC

## 2023-07-16 ENCOUNTER — Other Ambulatory Visit (HOSPITAL_BASED_OUTPATIENT_CLINIC_OR_DEPARTMENT_OTHER): Payer: Self-pay

## 2023-07-16 MED ORDER — BUPROPION HCL 75 MG PO TABS
75.0000 mg | ORAL_TABLET | Freq: Two times a day (BID) | ORAL | 0 refills | Status: DC
Start: 2023-07-16 — End: 2023-12-16
  Filled 2023-07-16: qty 90, 45d supply, fill #0

## 2023-09-16 ENCOUNTER — Other Ambulatory Visit: Payer: Self-pay | Admitting: Family Medicine

## 2023-09-16 DIAGNOSIS — N946 Dysmenorrhea, unspecified: Secondary | ICD-10-CM

## 2023-09-17 ENCOUNTER — Other Ambulatory Visit: Payer: Self-pay | Admitting: Family Medicine

## 2023-09-17 DIAGNOSIS — N946 Dysmenorrhea, unspecified: Secondary | ICD-10-CM

## 2023-09-18 ENCOUNTER — Other Ambulatory Visit (HOSPITAL_BASED_OUTPATIENT_CLINIC_OR_DEPARTMENT_OTHER): Payer: Self-pay

## 2023-09-19 ENCOUNTER — Encounter (HOSPITAL_BASED_OUTPATIENT_CLINIC_OR_DEPARTMENT_OTHER): Payer: Self-pay

## 2023-09-19 ENCOUNTER — Other Ambulatory Visit (HOSPITAL_BASED_OUTPATIENT_CLINIC_OR_DEPARTMENT_OTHER): Payer: Self-pay

## 2023-09-19 ENCOUNTER — Other Ambulatory Visit: Payer: Self-pay

## 2023-09-19 ENCOUNTER — Encounter: Payer: Self-pay | Admitting: Family Medicine

## 2023-09-19 DIAGNOSIS — N946 Dysmenorrhea, unspecified: Secondary | ICD-10-CM

## 2023-09-19 MED ORDER — BLISOVI 24 FE 1-20 MG-MCG(24) PO TABS
1.0000 | ORAL_TABLET | Freq: Every day | ORAL | 4 refills | Status: DC
Start: 2023-09-19 — End: 2023-09-19

## 2023-09-19 MED ORDER — BLISOVI 24 FE 1-20 MG-MCG(24) PO TABS
1.0000 | ORAL_TABLET | Freq: Every day | ORAL | 4 refills | Status: DC
  Filled 2023-09-19 – 2023-09-29 (×2): qty 84, 84d supply, fill #0
  Filled 2023-10-27: qty 84, 84d supply, fill #1

## 2023-09-29 ENCOUNTER — Other Ambulatory Visit (HOSPITAL_BASED_OUTPATIENT_CLINIC_OR_DEPARTMENT_OTHER): Payer: Self-pay

## 2023-10-24 ENCOUNTER — Encounter: Payer: Self-pay | Admitting: Advanced Practice Midwife

## 2023-10-27 ENCOUNTER — Other Ambulatory Visit: Payer: Self-pay | Admitting: Family Medicine

## 2023-10-27 DIAGNOSIS — R4589 Other symptoms and signs involving emotional state: Secondary | ICD-10-CM

## 2023-10-28 ENCOUNTER — Other Ambulatory Visit (HOSPITAL_BASED_OUTPATIENT_CLINIC_OR_DEPARTMENT_OTHER): Payer: Self-pay

## 2023-10-28 ENCOUNTER — Encounter: Payer: Self-pay | Admitting: Family Medicine

## 2023-10-28 ENCOUNTER — Other Ambulatory Visit: Payer: Self-pay

## 2023-10-28 DIAGNOSIS — R4589 Other symptoms and signs involving emotional state: Secondary | ICD-10-CM

## 2023-10-28 MED ORDER — HYDROXYCHLOROQUINE SULFATE 200 MG PO TABS
200.0000 mg | ORAL_TABLET | Freq: Every day | ORAL | 0 refills | Status: DC
  Filled 2023-10-29: qty 90, 90d supply, fill #0

## 2023-10-28 MED ORDER — VORTIOXETINE HBR 20 MG PO TABS
20.0000 mg | ORAL_TABLET | Freq: Every day | ORAL | 0 refills | Status: DC
  Filled 2023-10-29: qty 90, 90d supply, fill #0

## 2023-10-29 ENCOUNTER — Other Ambulatory Visit (HOSPITAL_BASED_OUTPATIENT_CLINIC_OR_DEPARTMENT_OTHER): Payer: Self-pay

## 2023-10-29 NOTE — Telephone Encounter (Signed)
 Medication sent.

## 2023-12-16 ENCOUNTER — Other Ambulatory Visit: Payer: Self-pay

## 2023-12-16 ENCOUNTER — Ambulatory Visit (INDEPENDENT_AMBULATORY_CARE_PROVIDER_SITE_OTHER): Admitting: Family Medicine

## 2023-12-16 ENCOUNTER — Other Ambulatory Visit (HOSPITAL_BASED_OUTPATIENT_CLINIC_OR_DEPARTMENT_OTHER): Payer: Self-pay

## 2023-12-16 ENCOUNTER — Encounter: Payer: Self-pay | Admitting: Family Medicine

## 2023-12-16 VITALS — BP 112/74 | HR 72 | Temp 98.2°F | Ht 67.0 in | Wt 146.6 lb

## 2023-12-16 DIAGNOSIS — Z0000 Encounter for general adult medical examination without abnormal findings: Secondary | ICD-10-CM

## 2023-12-16 DIAGNOSIS — M3500 Sicca syndrome, unspecified: Secondary | ICD-10-CM | POA: Diagnosis not present

## 2023-12-16 DIAGNOSIS — Z23 Encounter for immunization: Secondary | ICD-10-CM

## 2023-12-16 DIAGNOSIS — N946 Dysmenorrhea, unspecified: Secondary | ICD-10-CM | POA: Diagnosis not present

## 2023-12-16 DIAGNOSIS — Z793 Long term (current) use of hormonal contraceptives: Secondary | ICD-10-CM

## 2023-12-16 DIAGNOSIS — Z131 Encounter for screening for diabetes mellitus: Secondary | ICD-10-CM | POA: Diagnosis not present

## 2023-12-16 DIAGNOSIS — K559 Vascular disorder of intestine, unspecified: Secondary | ICD-10-CM | POA: Diagnosis not present

## 2023-12-16 DIAGNOSIS — Z13220 Encounter for screening for lipoid disorders: Secondary | ICD-10-CM | POA: Diagnosis not present

## 2023-12-16 DIAGNOSIS — R4589 Other symptoms and signs involving emotional state: Secondary | ICD-10-CM | POA: Diagnosis not present

## 2023-12-16 DIAGNOSIS — Z3041 Encounter for surveillance of contraceptive pills: Secondary | ICD-10-CM | POA: Diagnosis not present

## 2023-12-16 DIAGNOSIS — R768 Other specified abnormal immunological findings in serum: Secondary | ICD-10-CM

## 2023-12-16 DIAGNOSIS — Z1231 Encounter for screening mammogram for malignant neoplasm of breast: Secondary | ICD-10-CM | POA: Diagnosis not present

## 2023-12-16 LAB — COMPREHENSIVE METABOLIC PANEL WITH GFR
ALT: 11 U/L (ref 0–35)
AST: 17 U/L (ref 0–37)
Albumin: 4.6 g/dL (ref 3.5–5.2)
Alkaline Phosphatase: 29 U/L — ABNORMAL LOW (ref 39–117)
BUN: 16 mg/dL (ref 6–23)
CO2: 28 meq/L (ref 19–32)
Calcium: 9.8 mg/dL (ref 8.4–10.5)
Chloride: 103 meq/L (ref 96–112)
Creatinine, Ser: 0.83 mg/dL (ref 0.40–1.20)
GFR: 84.9 mL/min (ref >60.00)
Glucose, Bld: 76 mg/dL (ref 70–99)
Potassium: 4.4 meq/L (ref 3.5–5.1)
Sodium: 139 meq/L (ref 135–145)
Total Bilirubin: 0.9 mg/dL (ref 0.2–1.2)
Total Protein: 7.3 g/dL (ref 6.0–8.3)

## 2023-12-16 LAB — CBC
HCT: 40.9 % (ref 36.0–46.0)
Hemoglobin: 13.7 g/dL (ref 12.0–15.0)
MCHC: 33.5 g/dL (ref 30.0–36.0)
MCV: 92.4 fl (ref 78.0–100.0)
Platelets: 298 K/uL (ref 150.0–400.0)
RBC: 4.43 Mil/uL (ref 3.87–5.11)
RDW: 13.4 % (ref 11.5–15.5)
WBC: 7.9 K/uL (ref 4.0–10.5)

## 2023-12-16 LAB — HEMOGLOBIN A1C: Hgb A1c MFr Bld: 5.7 % (ref 4.6–6.5)

## 2023-12-16 LAB — LIPID PANEL
Cholesterol: 131 mg/dL (ref 0–200)
HDL: 61.3 mg/dL (ref >39.00)
LDL Cholesterol: 62 mg/dL (ref 0–99)
NonHDL: 69.43
Total CHOL/HDL Ratio: 2
Triglycerides: 39 mg/dL (ref 0.0–149.0)
VLDL: 7.8 mg/dL (ref 0.0–40.0)

## 2023-12-16 LAB — TSH: TSH: 0.57 u[IU]/mL (ref 0.35–5.50)

## 2023-12-16 MED ORDER — VORTIOXETINE HBR 20 MG PO TABS
20.0000 mg | ORAL_TABLET | Freq: Every day | ORAL | 3 refills | Status: AC
  Filled 2023-12-16 – 2024-02-03 (×3): qty 90, 90d supply, fill #0

## 2023-12-16 MED ORDER — HYDROXYZINE HCL 25 MG PO TABS
25.0000 mg | ORAL_TABLET | Freq: Three times a day (TID) | ORAL | 5 refills | Status: AC | PRN
  Filled 2023-12-16: qty 90, 15d supply, fill #0

## 2023-12-16 MED ORDER — BUPROPION HCL 75 MG PO TABS
75.0000 mg | ORAL_TABLET | Freq: Two times a day (BID) | ORAL | 3 refills | Status: AC
  Filled 2023-12-16: qty 180, 90d supply, fill #0

## 2023-12-16 MED ORDER — BLISOVI 24 FE 1-20 MG-MCG(24) PO TABS
1.0000 | ORAL_TABLET | Freq: Every day | ORAL | 4 refills | Status: AC
  Filled 2023-12-16: qty 84, 84d supply, fill #0
  Filled 2024-03-27 (×3): qty 84, 84d supply, fill #1

## 2023-12-16 NOTE — Progress Notes (Signed)
 [name] , 1975-11-11, 48 y.o., female MRN: [id number] Patient Care Team    Relationship Specialty Notifications Start End  Catherine Charlies LABOR, DO PCP - General Family Medicine  08/09/20   Mat Browning, MD Consulting Physician Obstetrics and Gynecology  09/04/17   Elspeth Lauraine DEL, OD Referring Physician   11/27/21   Dorothey No May, MD Referring Physician   11/27/21   Avram Lupita FORBES, MD Consulting Physician Gastroenterology  11/27/21     Chief Complaint  Patient presents with   Annual Exam     Subjective: [name] is a 48 y.o. Pt presents for CPE and chronic conditions management.   All past medical history updated today if appropriate Medication reconciliation completed today  Health maintenance:  Colonoscopy: No fhx.  Completed by Dr. Avram 01/23/2021. She will call   Mammogram: completed:05/31/2021, [facility]> ordered Cervical cancer screening: last pap: 2020> Dr. Mat- schedule Immunizations: tdap-administered, Influenza will get through work (encouraged yearly) Infectious disease screening: HIV completed during pregnancy.  Hep C completed DEXA:routine screen recommended to start at age 19-65 Assistive device: none Oxygen ldz:wnwz Patient has a Dental home. Hospitalizations/ED visits: reviewed  Dysmenorrhea/Encounter for birth control pills maintenance Patient started back on birth control pills 2021  for dysmenorrhea.  She has noticed hot flashes, mostly at night.  Her mother started perimenopause/menopausal symptoms earlier in life/40s as well.  She would like to continue with the birth control pills at this time.  Her Pap is not update.  She follows with gynecology for cervical cancer screenings.  Depressed mood She reports she has been prescribed Trintellix  20 mg daily and Wellbutrin  75 mg twice daily for many years.  This regimen has still working well for her and she would like to continue.     Sjogren's disease.  Patient reports the  Plaquenil  did help immensely with her Sjogren's symptoms in her eyes and GI issues.  Unfortunately, she developed a dermatitis with the use of Plaquenil  and had to discontinue.  Since that time she has had an increase in her autoimmune symptoms and GI symptoms.  She is established with rheumatology-Dr. Ishmael  Prior note: Patient has noticed in the last couple of months she has had notably worsened dry eyes, dry mouth with ulcers, arthralgias (especially in her wrist) and fatigue.  She reports 1 episode of tachycardia without associated symptoms, with a heart rate of 155 with no activity. Patient also wears hearing aids, but states she has noticed that he decrease in her hearing acuity as well. She has a history of ischemic colitis of unknown etiology and depression.   She has been seen by her ophthalmologist and her rheumatologist. She is prescribed Restasis  and had been using chlorhexidine  mouth solution the ulcerations of the mouth. She has had a significantly high ANA with repeat confirmation of 1:1280-nuclear homogenous.  Negative reflex panel.  IgG and IgM's have been normal.  Complements normal. Vitamin D  levels were normal August 2023 with a level of 61.88.  Patient has continued to supplement with vitamin D .      12/16/2023    1:35 PM 12/03/2022   12:25 PM 09/10/2022   12:09 PM 11/27/2021    1:42 PM 08/09/2020    3:28 PM  Depression screen PHQ 2/9  Decreased Interest 0 0 0 0 0  Down, Depressed, Hopeless 0 0 0 0 0  PHQ - 2 Score 0 0 0 0 0  Altered sleeping 1 0  3   Tired,  decreased energy 3 0  2   Change in appetite 0 0  0   Feeling bad or failure about yourself  0 0  0   Trouble concentrating 1 0  0   Moving slowly or fidgety/restless 0 0  0   Suicidal thoughts 0 0  0   PHQ-9 Score 5 0  5   Difficult doing work/chores Somewhat difficult        Allergies  Allergen Reactions   Morphine Other (See Comments)    Possible anaphylaxis/airway swelling causing delayed extubation    Cefaclor Hives   Social History   Social History Narrative   Married 2 children 1 son 1 daughter   Son has developmental abnormalities and has required several surgeries   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   Menstrual migraine    menstrual migraines   PONV (postoperative nausea and vomiting)    POSTPARTUM DEPRESSION 09/26/2009   Qualifier: Diagnosis of  By: Daryl FNP, Melissa S    Rectal bleeding 07/16/2021   Tinea versicolor 09/17/2022   Umbilical hernia    Past Surgical History:  Procedure Laterality Date   ANKLE SURGERY     APPENDECTOMY     CESAREAN SECTION     x 2   GANGLION CYST EXCISION     X 2   HERNIA REPAIR     VENTRAL   INSERTION OF MESH N/A 01/07/2017   Procedure: INSERTION OF MESH;  Surgeon: Rubin Calamity, MD;  Location: [facility] OR;  Service: General;  Laterality: N/A;   INTUSSUSCEPTION REPAIR     MOUTH SURGERY     SHOULDER ARTHROSCOPY     x 2 right   TONSILLECTOMY AND ADENOIDECTOMY     TUBAL LIGATION     TYMPANOSTOMY TUBE PLACEMENT     UMBILICAL HERNIA REPAIR N/A 01/07/2017   Procedure: LAPAROSCOPIC UMBILICAL HERNIA REPAIR WITH MESH;  Surgeon: Rubin Calamity, MD;  Location[facility] OR;  Service: General;  Laterality: N/A;   WISDOM TOOTH EXTRACTION     Family History  Problem Relation Age of Onset   Parkinson's disease Mother    Depression Mother    Colon polyps Father    Hypothyroidism Father    Leukemia Father     Other Brother        essential tremor   Hypertension Brother    Polycystic kidney disease Maternal Uncle    Cancer Maternal Uncle        GBM   Polycystic kidney disease Maternal Grandmother    Pancreatic cancer Maternal Grandfather    Heart disease Paternal Grandmother    Diabetes Paternal Grandmother    Alzheimer's disease Paternal Grandfather    Cancer Cousin        breast   Breast cancer Cousin    Cancer Cousin        breast   Breast cancer Cousin    Colon  cancer Neg Hx    Rectal cancer Neg Hx    Stomach cancer Neg Hx    Esophageal cancer Neg Hx    Allergies as of 12/16/2023       Reactions   Morphine Other (See Comments)   Possible anaphylaxis/airway swelling causing delayed extubation   Cefaclor Hives        Medication List        Accurate as of December 16, 2023  3:40 PM. If you have any questions, ask your nurse or doctor.          STOP taking these medications    hydroxychloroquine  200 MG tablet Commonly known as: PLAQUENIL  Stopped by: Charlies Bellini   ondansetron  4 MG disintegrating tablet Commonly known as: ZOFRAN -ODT Stopped by: Tamon Parkerson   scopolamine  1 MG/3DAYS Commonly known as: TRANSDERM-SCOP Stopped by: Charlies Bellini       TAKE these medications    aspirin-acetaminophen -caffeine 250-250-65 MG tablet Commonly known as: EXCEDRIN MIGRAINE Take 2 tablets by mouth daily as needed for headache or migraine.   Blisovi  24 Fe 1-20 MG-MCG(24) tablet Generic drug: Norethindrone  Acetate-Ethinyl Estrad-FE Take 1 tablet by mouth daily.   buPROPion  75 MG tablet Commonly known as: WELLBUTRIN  Take 1 tablet (75 mg total) by mouth 2 (two) times daily.   cetirizine 10 MG tablet Commonly known as: ZYRTEC Take 10 mg by mouth daily.   clotrimazole  1 % external solution Commonly known as: LOTRIMIN  Apply 1 Application topically 2 (two) times daily as needed.   hydrOXYzine  25 MG tablet Commonly known as: ATARAX  Take 1-2 tablets (25-50 mg total) by  mouth 3 (three) times daily as needed.   multivitamin with minerals Tabs tablet Take 1 tablet by mouth daily.   Restasis  0.05 % ophthalmic emulsion Generic drug: cycloSPORINE  Place 1 drop into both eyes every 12 (twelve) hours.   Vitamin D  50 MCG (2000 UT) Caps Take by mouth.   vortioxetine  HBr 20 MG Tabs tablet Commonly known as: Trintellix  Take 1 tablet (20 mg total) by mouth daily.        All past medical history, surgical history, allergies, family history, immunizations andmedications were updated in the EMR today and reviewed under the history and medication portions of their EMR.     ROS Negative, with the exception of above mentioned in HPI   Objective:  BP 112/74   Pulse 72   Temp 98.2 F (36.8 C)   Ht 5' 7 (1.702 m)   Wt 146 lb 9.6 oz (66.5 kg)   SpO2 98%   BMI 22.96 kg/m  Body mass index is 22.96 kg/m. Physical Exam Vitals and nursing note reviewed.  Constitutional:      General: She is not in acute distress.    Appearance: Normal appearance. She is not ill-appearing or toxic-appearing.  HENT:     Head: Normocephalic and atraumatic.     Right Ear: Tympanic membrane, ear canal and external ear normal. There is no impacted cerumen.     Left Ear: Tympanic membrane, ear canal and external ear normal. There is no impacted cerumen.     Nose: No congestion or rhinorrhea.     Mouth/Throat:     Mouth: Mucous membranes are moist.     Pharynx: Oropharynx is clear. No oropharyngeal exudate or posterior oropharyngeal erythema.  Eyes:     General: No scleral icterus.       Right eye: No discharge.        Left eye: No discharge.  Extraocular Movements: Extraocular movements intact.     Conjunctiva/sclera: Conjunctivae normal.     Pupils: Pupils are equal, round, and reactive to light.  Cardiovascular:     Rate and Rhythm: Normal rate and regular rhythm.     Pulses: Normal pulses.     Heart sounds: Normal heart sounds. No murmur heard.    No friction rub.  No gallop.  Pulmonary:     Effort: Pulmonary effort is normal. No respiratory distress.     Breath sounds: Normal breath sounds. No stridor. No wheezing, rhonchi or rales.  Chest:     Chest wall: No tenderness.  Abdominal:     General: Abdomen is flat. Bowel sounds are normal. There is no distension.     Palpations: Abdomen is soft. There is no mass.     Tenderness: There is no abdominal tenderness. There is no right CVA tenderness, left CVA tenderness, guarding or rebound.     Hernia: No hernia is present.  Musculoskeletal:        General: No swelling, tenderness or deformity. Normal range of motion.     Cervical back: Normal range of motion and neck supple. No rigidity or tenderness.     Right lower leg: No edema.     Left lower leg: No edema.  Lymphadenopathy:     Cervical: No cervical adenopathy.  Skin:    General: Skin is warm and dry.     Coloration: Skin is not jaundiced or pale.     Findings: No bruising, erythema, lesion or rash.  Neurological:     General: No focal deficit present.     Mental Status: She is alert and oriented to person, place, and time. Mental status is at baseline.     Cranial Nerves: No cranial nerve deficit.     Sensory: No sensory deficit.     Motor: No weakness.     Coordination: Coordination normal.     Gait: Gait normal.     Deep Tendon Reflexes: Reflexes normal.  Psychiatric:        Mood and Affect: Mood normal.        Behavior: Behavior normal.        Thought Content: Thought content normal.        Judgment: Judgment normal.     No results found. No results found. No results found for this or any previous visit (from the past 24 hours).  Assessment/Plan: Yuritzy E Franks is a 48 y.o. female present for OV for CPE and chronic condition management Sjogren syndrome, unspecified (HCC)/ischemic colitis/positive ANA Unfortunately Plaquenil  caused her dermatitis.  Since stopping the medication dermatitis has improved but her Sjogren's  syndrome symptoms and abdominal symptoms have resurfaced. Continue Restasis  per optometry Recommend Biotene daily for mouth dryness. Encouraged her to discuss with rheumatology-Dr. Ishmael other possible treatment options, since she did respond to Plaquenil .  Dysmenorrhea/Encounter for birth control pills maintenance Stable She is having hot flashes/perimenopausal symptoms. Continue Loestrin  24 FE Follow per routine to gynecology for cervical cancer screening-encouraged her to make an appointment she is overdue.  Depressed mood Stable Continue Trintellix  20 mg daily Continue Wellbutrin  75 mg twice daily Vistaril  as needed Follow-up yearly, unless needed sooner.  Breast cancer screening by mammogram - MM 3D SCREENING MAMMOGRAM BILATERAL BREAST; Future Long term current use of hormonal contraceptive - Hemoglobin A1c - Lipid panel Lipid screening - Lipid panel Diabetes mellitus screening - Hemoglobin A1c Need for Tdap vaccination - Tdap vaccine greater than or equal to 7yo  IM  Routine general medical examination at a health care facility (Primary) - CBC - Comprehensive metabolic panel with GFR - TSH Patient was encouraged to exercise greater than 150 minutes a week. Patient was encouraged to choose a diet filled with fresh fruits and vegetables, and lean meats. AVS provided to patient today for education/recommendation on gender specific health and safety maintenance. Colonoscopy: No fhx.  Completed by Dr. Avram 01/23/2021. She will call   Mammogram: completed:05/31/2021, [facility]> ordered Cervical cancer screening: last pap: 2020> Dr. Mat- schedule Immunizations: tdap-administered, Influenza will get through work (encouraged yearly) Infectious disease screening: HIV completed during pregnancy.  Hep C completed DEXA:routine screen recommended to start at age 82-65  Reviewed expectations re: course of current medical issues. Discussed self-management of symptoms. Outlined  signs and symptoms indicating need for more acute intervention. Patient verbalized understanding and all questions were answered. Patient received an After-Visit Summary.    Orders Placed This Encounter  Procedures   MM 3D SCREENING MAMMOGRAM BILATERAL BREAST   Tdap vaccine greater than or equal to 7yo IM   CBC   Comprehensive metabolic panel with GFR   Hemoglobin A1c   Lipid panel   TSH   Meds ordered this encounter  Medications   vortioxetine  HBr (TRINTELLIX ) 20 MG TABS tablet    Sig: Take 1 tablet (20 mg total) by mouth daily.    Dispense:  90 tablet    Refill:  3   hydrOXYzine  (ATARAX ) 25 MG tablet    Sig: Take 1-2 tablets (25-50 mg total) by mouth 3 (three) times daily as needed.    Dispense:  90 tablet    Refill:  5   Norethindrone  Acetate-Ethinyl Estrad-FE (BLISOVI  24 FE) 1-20 MG-MCG(24) tablet    Sig: Take 1 tablet by mouth daily.    Dispense:  84 tablet    Refill:  4   buPROPion  (WELLBUTRIN ) 75 MG tablet    Sig: Take 1 tablet (75 mg total) by mouth 2 (two) times daily.    Dispense:  180 tablet    Refill:  3   Referral Orders  No referral(s) requested today     Note is dictated utilizing voice recognition software. Although note has been proof read prior to signing, occasional typographical errors still can be missed. If any questions arise, please do not hesitate to call for verification.   electronically signed by:  Charlies Bellini, DO  [facility] - [place]

## 2023-12-16 NOTE — Patient Instructions (Addendum)

## 2023-12-17 ENCOUNTER — Ambulatory Visit: Payer: Self-pay | Admitting: Family Medicine

## 2024-01-15 ENCOUNTER — Ambulatory Visit
Admission: RE | Admit: 2024-01-15 | Discharge: 2024-01-15 | Disposition: A | Discharge status: Home / Self Care | Source: Ambulatory Visit | Attending: Family Medicine | Admitting: Family Medicine

## 2024-01-15 DIAGNOSIS — Z1231 Encounter for screening mammogram for malignant neoplasm of breast: Secondary | ICD-10-CM | POA: Diagnosis not present

## 2024-01-22 ENCOUNTER — Other Ambulatory Visit (HOSPITAL_BASED_OUTPATIENT_CLINIC_OR_DEPARTMENT_OTHER): Payer: Self-pay

## 2024-01-22 ENCOUNTER — Other Ambulatory Visit: Payer: Self-pay

## 2024-01-26 ENCOUNTER — Other Ambulatory Visit: Payer: Self-pay

## 2024-02-02 ENCOUNTER — Other Ambulatory Visit (HOSPITAL_BASED_OUTPATIENT_CLINIC_OR_DEPARTMENT_OTHER): Payer: Self-pay

## 2024-02-03 ENCOUNTER — Other Ambulatory Visit (HOSPITAL_BASED_OUTPATIENT_CLINIC_OR_DEPARTMENT_OTHER): Payer: Self-pay

## 2024-03-27 ENCOUNTER — Other Ambulatory Visit (HOSPITAL_BASED_OUTPATIENT_CLINIC_OR_DEPARTMENT_OTHER): Payer: Self-pay

## 2024-04-15 ENCOUNTER — Other Ambulatory Visit (HOSPITAL_BASED_OUTPATIENT_CLINIC_OR_DEPARTMENT_OTHER): Payer: Self-pay
